# Patient Record
Sex: Female | Born: 1975 | Race: White | Hispanic: No | Marital: Married | Smoking: Former smoker
Health system: Southern US, Community
[De-identification: ages and names within clinical notes are randomized; demographics above are authoritative.]

## PROBLEM LIST (undated history)

## (undated) DIAGNOSIS — J45909 Unspecified asthma, uncomplicated: Secondary | ICD-10-CM

## (undated) HISTORY — PX: APPENDECTOMY: SHX54

## (undated) HISTORY — DX: Unspecified asthma, uncomplicated: J45.909

---

## 2018-06-30 DIAGNOSIS — J45909 Unspecified asthma, uncomplicated: Secondary | ICD-10-CM | POA: Insufficient documentation

## 2018-06-30 DIAGNOSIS — F411 Generalized anxiety disorder: Secondary | ICD-10-CM | POA: Insufficient documentation

## 2018-07-25 ENCOUNTER — Encounter: Payer: Self-pay | Admitting: Psychiatry

## 2018-07-25 ENCOUNTER — Other Ambulatory Visit: Payer: Self-pay

## 2018-07-25 ENCOUNTER — Ambulatory Visit: Payer: 59 | Admitting: Psychiatry

## 2018-07-25 VITALS — BP 132/80 | HR 80 | Temp 99.9°F | Wt 221.4 lb

## 2018-07-25 DIAGNOSIS — F5105 Insomnia due to other mental disorder: Secondary | ICD-10-CM

## 2018-07-25 DIAGNOSIS — F4322 Adjustment disorder with anxiety: Secondary | ICD-10-CM | POA: Diagnosis not present

## 2018-07-25 DIAGNOSIS — F410 Panic disorder [episodic paroxysmal anxiety] without agoraphobia: Secondary | ICD-10-CM | POA: Diagnosis not present

## 2018-07-25 MED ORDER — HYDROXYZINE PAMOATE 25 MG PO CAPS: 25.0000 mg | ORAL_CAPSULE | Freq: Every evening | ORAL | 1 refills | Status: DC | PRN

## 2018-07-25 NOTE — Patient Instructions (Signed)
Hydroxyzine capsules or tablets What is this medicine? HYDROXYZINE (hye DROX i zeen) is an antihistamine. This medicine is used to treat allergy symptoms. It is also used to treat anxiety and tension. This medicine can be used with other medicines to induce sleep before surgery. This medicine may be used for other purposes; ask your health care provider or pharmacist if you have questions. COMMON BRAND NAME(S): ANX, Atarax, Rezine, Vistaril What should I tell my health care provider before I take this medicine? They need to know if you have any of these conditions: -any chronic illness -difficulty passing urine -glaucoma -heart disease -kidney disease -liver disease -lung disease -an unusual or allergic reaction to hydroxyzine, cetirizine, other medicines, foods, dyes, or preservatives -pregnant or trying to get pregnant -breast-feeding How should I use this medicine? Take this medicine by mouth with a full glass of water. Follow the directions on the prescription label. You may take this medicine with food or on an empty stomach. Take your medicine at regular intervals. Do not take your medicine more often than directed. Talk to your pediatrician regarding the use of this medicine in children. Special care may be needed. While this drug may be prescribed for children as young as 6 years of age for selected conditions, precautions do apply. Patients over 65 years old may have a stronger reaction and need a smaller dose. Overdosage: If you think you have taken too much of this medicine contact a poison control center or emergency room at once. NOTE: This medicine is only for you. Do not share this medicine with others. What if I miss a dose? If you miss a dose, take it as soon as you can. If it is almost time for your next dose, take only that dose. Do not take double or extra doses. What may interact with this medicine? -alcohol -barbiturate medicines for sleep or seizures -medicines for  colds, allergies -medicines for depression, anxiety, or emotional disturbances -medicines for pain -medicines for sleep -muscle relaxants This list may not describe all possible interactions. Give your health care provider a list of all the medicines, herbs, non-prescription drugs, or dietary supplements you use. Also tell them if you smoke, drink alcohol, or use illegal drugs. Some items may interact with your medicine. What should I watch for while using this medicine? Tell your doctor or health care professional if your symptoms do not improve. You may get drowsy or dizzy. Do not drive, use machinery, or do anything that needs mental alertness until you know how this medicine affects you. Do not stand or sit up quickly, especially if you are an older patient. This reduces the risk of dizzy or fainting spells. Alcohol may interfere with the effect of this medicine. Avoid alcoholic drinks. Your mouth may get dry. Chewing sugarless gum or sucking hard candy, and drinking plenty of water may help. Contact your doctor if the problem does not go away or is severe. This medicine may cause dry eyes and blurred vision. If you wear contact lenses you may feel some discomfort. Lubricating drops may help. See your eye doctor if the problem does not go away or is severe. If you are receiving skin tests for allergies, tell your doctor you are using this medicine. What side effects may I notice from receiving this medicine? Side effects that you should report to your doctor or health care professional as soon as possible: -fast or irregular heartbeat -difficulty passing urine -seizures -slurred speech or confusion -tremor Side effects that   usually do not require medical attention (report to your doctor or health care professional if they continue or are bothersome): -constipation -drowsiness -fatigue -headache -stomach upset This list may not describe all possible side effects. Call your doctor for  medical advice about side effects. You may report side effects to FDA at 1-800-FDA-1088. Where should I keep my medicine? Keep out of the reach of children. Store at room temperature between 15 and 30 degrees C (59 and 86 degrees F). Keep container tightly closed. Throw away any unused medicine after the expiration date. NOTE: This sheet is a summary. It may not cover all possible information. If you have questions about this medicine, talk to your doctor, pharmacist, or health care provider.  2018 Elsevier/Gold Standard (2008-01-13 14:50:59)  

## 2018-07-25 NOTE — Progress Notes (Signed)
Psychiatric Initial Adult Assessment   Patient Identification: Kristin Vargas MRN:  [id number] Date of Evaluation:  07/25/2018 Referral Source:Dr.Neelam Welton Flakes Chief Complaint:  ' I am here to establish care." Chief Complaint    Establish Care; Stress; Anxiety; Panic Attack     Visit Diagnosis:    ICD-10-CM   1. Panic attacks F41.0   2. Adjustment disorder with anxious mood F43.22   3. Insomnia due to mental condition F51.05     History of Present Illness: [name] is a 42 year old Caucasian female, married, employed, lives in [place], has a history of recent onset anxiety attacks presented to the clinic today to establish care.  Patient reports she got a promotion at work almost a month ago.  She reports soon after that she started feeling anxious because she was going through a lot of changes at work.  She reports one night she woke up with severe chest pain and shortness of breath and felt as though she was going to have a heart attack.  She reports the next day she went to urgent care and was evaluated.  She reports her primary medical doctor did further workup on her and she was told that she does not have any cardiac problems.  She was prescribed medications for anxiety attacks like Lexapro, Klonopin and BuSpar by her PMD 3 weeks ago.  Patient however reports she did not even pick up the prescription.  She reports she is not a 'medication person' and does not like to be on a lot of medications in general.  Pt currently describes her symptoms as chest pain, racing thoughts, feeling nervous, shortness of breath which usually happens towards the end of the day and last for a few minutes to an hour or so.  She reports this has been ongoing since the past 3 weeks.  Since her last full-blown panic attack which happened the first time she went to the urgent care she has not had a full-blown panic attack however has had several partial anxiety attacks.  Patient reports she is unable to sleep at night  due to her anxiety symptoms and racing thoughts.  Patient reports she also has some appetite changes on and off.  She reports it happens when she is very stressed out.  Patient denies any suicidality.  Patient denies any perceptual disturbances.  Patient denies any homicidality.  Patient denies any history of trauma.  Patient denies any bipolar disorder  symptoms like mania or hypomanic symptoms.  Patient does report she had a difficult childhood.  She reports her mother and father separated when she were very young.  Her mother was an alcoholic.  She was raised by her father and his several wife's.  She reports he married several times and it was a struggle.  She reports she ran away from home at the age of 42 and got pregnant at the age of 58.  Her son is now 21 years old.  She denies abusing any drugs or alcohol.   Associated Signs/Symptoms: Depression Symptoms:  depressed mood, difficulty concentrating, anxiety, panic attacks, disturbed sleep, decreased appetite, (Hypo) Manic Symptoms:  denies Anxiety Symptoms:  Excessive Worry, Panic Symptoms, Psychotic Symptoms:  denies PTSD Symptoms: Negative  Past Psychiatric History: Patient was recently diagnosed with anxiety and was prescribed medications by her PMD 3 weeks ago.  Patient however has been noncompliant with medications.  Patient denies any inpatient mental health admissions.  Patient denies any suicide attempts.  Previous Psychotropic Medications: Yes  -Was prescribed Lexapro, Klonopin  and BuSpar however she reports she did not even pick up the prescription  Substance Abuse History in the last 12 months:  No.  Consequences of Substance Abuse: Negative  Past Medical History:  Past Medical History:  Diagnosis Date  . Asthma     Past Surgical History:  Procedure Laterality Date  . APPENDECTOMY      Family Psychiatric History: Mother-alcohol abuse.  Family History:  Family History  Problem Relation Age of  Onset  . Alcohol abuse Mother   . Epilepsy Paternal Grandmother   . Epilepsy Brother     Social History:   Social History   Socioeconomic History  . Marital status: Married    Spouse name: albano  . Number of children: 1  . Years of education: Not on file  . Highest education level: GED or equivalent  Occupational History  . Not on file  Social Needs  . Financial resource strain: Not hard at all  . Food insecurity:    Worry: Never true    Inability: Never true  . Transportation needs:    Medical: No    Non-medical: No  Tobacco Use  . Smoking status: Former Smoker    Types: Cigarettes    Last attempt to quit: 07/25/2000    Years since quitting: 18.0  . Smokeless tobacco: Never Used  Substance and Sexual Activity  . Alcohol use: Never    Frequency: Never  . Drug use: Never  . Sexual activity: Yes    Birth control/protection: None  Lifestyle  . Physical activity:    Days per week: 0 days    Minutes per session: 0 min  . Stress: To some extent  Relationships  . Social connections:    Talks on phone: Not on file    Gets together: Not on file    Attends religious service: Never    Active member of club or organization: No    Attends meetings of clubs or organizations: Never    Relationship status: Married  Other Topics Concern  . Not on file  Social History Narrative  . Not on file    Additional Social History: Patient is married.  She lives with her husband and her aunt in [place].  She reports she moved here from [place] where she was living with her husband a few years ago.  She has 1 adult son who is 12 years old with whom she has a good relationship with.  She is employed with [employer] and recently got promoted.  Patient reports she had a difficult childhood.  [place] was [place]  She was raised by her father and his several wife's.  Her dad and her mom separated when she was very young.  Her mom was an alcoholic.  She continues to  not have a good relationship with her mom.  She however reports she has a good relationship with her dad.  Allergies:   Allergies  Allergen Reactions  . Morphine And Related   . Penicillins     Metabolic Disorder Labs: No results found for: HGBA1C, MPG No results found for: PROLACTIN No results found for: CHOL, TRIG, HDL, CHOLHDL, VLDL, LDLCALC   Current Medications: Current Outpatient Medications  Medication Sig Dispense Refill  . hydrOXYzine (VISTARIL) 25 MG capsule Take 1-2 capsules (25-50 mg total) by mouth at bedtime as needed for anxiety. Anxiety and sleep 60 capsule 1   No current facility-administered medications for this visit.     Neurologic: Headache:  No Seizure: No Paresthesias:No  Musculoskeletal: Strength & Muscle Tone: within normal limits Gait & Station: normal Patient leans: N/A  Psychiatric Specialty Exam: Review of Systems  Psychiatric/Behavioral: The patient is nervous/anxious and has insomnia.   All other systems reviewed and are negative.   Blood pressure 132/80, pulse 80, temperature 99.9 F (37.7 C), temperature source Oral, weight 221 lb 6.4 oz (100.4 kg), last menstrual period 07/18/2018.There is no height or weight on file to calculate BMI.  General Appearance: Casual  Eye Contact:  Fair  Speech:  Clear and Coherent  Volume:  Normal  Mood:  Anxious  Affect:  Congruent  Thought Process:  Goal Directed and Descriptions of Associations: Intact  Orientation:  Full (Time, Place, and Person)  Thought Content:  Logical  Suicidal Thoughts:  No  Homicidal Thoughts:  No  Memory:  Immediate;   Fair Recent;   Fair Remote;   Fair  Judgement:  Fair  Insight:  Fair  Psychomotor Activity:  Normal  Concentration:  Concentration: Fair and Attention Span: Fair  Recall:  [employer] of Knowledge:Fair  Language: Fair  Akathisia:  No  Handed:  Right  AIMS (if indicated):  na  Assets:  Communication Skills Desire for  Improvement Housing Intimacy Social Support Talents/Skills Transportation Vocational/Educational  ADL's:  Intact  Cognition: WNL  Sleep:  restless    Treatment Plan Summary:Mossie is a 42 year old Caucasian female who is married, employed, lives in [place], has a history of recent onset anxiety attacks and insomnia presented to the clinic today to establish care.  Patient is biologically predisposed given her family history.  Patient does have psychosocial stressors of recent job related changes.  She does have good social support system.  She denies any past history of suicide attempts.  She denies any comorbid substance abuse problems or comorbid health problems.  Patient will benefit from psychotherapy sessions as well as medication management as noted below. Medication management and Plan as noted below  Plan For panic attacks She completed a GAD 7 and scored very low-5 Patient reports she does not want medications and prefers psychotherapy sessions. We will refer her to our therapist here in clinic. We will give her hydroxyzine 25-50 mg at bedtime as needed for anxiety attacks.  Adjustment disorder Patient completed PHQ 9 and scored very low-2 Hence its likely that patient does not have any overt depressive symptoms at this time.  Patient also prefers psychotherapy sessions over medications.  Will refer her for the same.  For insomnia Discussed with patient to use hydroxyzine 25-50 mg at bedtime for insomnia  Will order TSH-patient reports she will get it done with her PMD.  Follow-up in clinic in 3 weeks or sooner if needed.  More than 50 % of the time was spent for psychoeducation and supportive psychotherapy and care coordination.  This note was generated in part or whole with voice recognition software. Voice recognition is usually quite accurate but there are transcription errors that can and very often do occur. I apologize for any typographical errors that were not  detected and corrected.     Jomarie Longs, MD 11/12/20199:57 AM

## 2018-07-26 ENCOUNTER — Encounter: Payer: Self-pay | Admitting: Psychiatry

## 2018-08-05 ENCOUNTER — Ambulatory Visit: Payer: 59 | Admitting: Licensed Clinical Social Worker

## 2018-08-05 ENCOUNTER — Encounter: Payer: Self-pay | Admitting: Licensed Clinical Social Worker

## 2018-08-05 DIAGNOSIS — F4322 Adjustment disorder with anxiety: Secondary | ICD-10-CM | POA: Diagnosis not present

## 2018-08-05 DIAGNOSIS — F410 Panic disorder [episodic paroxysmal anxiety] without agoraphobia: Secondary | ICD-10-CM | POA: Diagnosis not present
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
## 2018-08-15 ENCOUNTER — Ambulatory Visit: Payer: 59 | Admitting: Psychiatry

## 2018-08-15 ENCOUNTER — Other Ambulatory Visit: Payer: Self-pay

## 2018-08-15 ENCOUNTER — Encounter: Payer: Self-pay | Admitting: Psychiatry

## 2018-08-15 VITALS — BP 133/84 | HR 83 | Temp 99.2°F | Ht 65.55 in | Wt 223.6 lb

## 2018-08-15 DIAGNOSIS — F410 Panic disorder [episodic paroxysmal anxiety] without agoraphobia: Secondary | ICD-10-CM

## 2018-08-15 DIAGNOSIS — F5105 Insomnia due to other mental disorder: Secondary | ICD-10-CM

## 2018-08-15 DIAGNOSIS — F4322 Adjustment disorder with anxiety: Secondary | ICD-10-CM | POA: Diagnosis not present

## 2018-08-15 NOTE — Progress Notes (Signed)
BH MD OP Progress Note  08/15/2018 3:45 PM Fawne [place]  MRN:  [id number]  Chief Complaint: ' I am here for follow up." Chief Complaint    Follow-up; Medication Refill     HPI: Albie is a 42 year old Caucasian female, married, employed, lives in [place], has a history of recent onset anxiety attacks, presented to the clinic today for a follow-up visit.  Patient today reports her mood symptoms as improving.  She reports she did have some anxiety attacks however they are getting better.  She is better able to cope with it.  She reports she has more good days than bad days now.  She is on hydroxyzine as needed.  She reports she may have taken it a couple of times with good benefit.  She denies any side effects to medications.  Patient reports sleep is improved.  She makes use of hydroxyzine at bedtime when she needs it for sleep and reports it as helpful.  Patient reports she has started psychotherapy sessions with Ms. Heidi Dach and reports she wants to continue to do so.  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  She reports she looks forward to working on that with her therapist.  She is not interested in any medication readjustment today and just wants to stay on hydroxyzine.  Patient denies any other concerns today. Visit Diagnosis:    ICD-10-CM   1. Panic attacks F41.0   2. Adjustment disorder with anxious mood F43.22   3. Insomnia due to mental condition F51.05     Past Psychiatric History: Have reviewed past psychiatric history from my progress note on 07/25/2018  Past Medical History:  Past Medical History:  Diagnosis Date  . Asthma     Past Surgical History:  Procedure Laterality Date  . APPENDECTOMY      Family Psychiatric History: Reviewed family psychiatric history from my progress note on 07/25/2018  Family History:  Family History  Problem Relation Age of Onset  . Alcohol abuse Mother   . Epilepsy Paternal  Grandmother   . Epilepsy Brother     Social History: Reviewed social history from my progress note on 07/25/2018. Social History   Socioeconomic History  . Marital status: Married    Spouse name: albano  . Number of children: 1  . Years of education: Not on file  . Highest education level: GED or equivalent  Occupational History  . Not on file  Social Needs  . Financial resource strain: Not hard at all  . Food insecurity:    Worry: Never true    Inability: Never true  . Transportation needs:    Medical: No    Non-medical: No  Tobacco Use  . Smoking status: Former Smoker    Types: Cigarettes    Last attempt to quit: 07/25/2000    Years since quitting: 18.0  . Smokeless tobacco: Never Used  Substance and Sexual Activity  . Alcohol use: Never    Frequency: Never  . Drug use: Never  . Sexual activity: Yes    Birth control/protection: None  Lifestyle  . Physical activity:    Days per week: 0 days    Minutes per session: 0 min  . Stress: To some extent  Relationships  . Social connections:    Talks on phone: Not on file    Gets together: Not on file    Attends religious service: Never    Active member of club or organization: No  Attends meetings of clubs or organizations: Never    Relationship status: Married  Other Topics Concern  . Not on file  Social History Narrative  . Not on file    Allergies:  Allergies  Allergen Reactions  . Morphine And Related   . Penicillins     Metabolic Disorder Labs: No results found for: HGBA1C, MPG No results found for: PROLACTIN No results found for: CHOL, TRIG, HDL, CHOLHDL, VLDL, LDLCALC No results found for: TSH  Therapeutic Level Labs: No results found for: LITHIUM No results found for: VALPROATE No components found for:  CBMZ  Current Medications: Current Outpatient Medications  Medication Sig Dispense Refill  . hydrOXYzine (VISTARIL) 25 MG capsule Take 1-2 capsules (25-50 mg total) by mouth at bedtime as  needed for anxiety. Anxiety and sleep 60 capsule 1   No current facility-administered medications for this visit.      Musculoskeletal: Strength & Muscle Tone: within normal limits Gait & Station: normal Patient leans: N/A  Psychiatric Specialty Exam: Review of Systems  Psychiatric/Behavioral: The patient is nervous/anxious (improving).   All other systems reviewed and are negative.   Blood pressure 133/84, pulse 83, temperature 99.2 F (37.3 C), temperature source Oral, height 5' 5.55" (1.665 m), weight 223 lb 9.6 oz (101.4 kg), last menstrual period 07/18/2018.Body mass index is 36.59 kg/m.  General Appearance: Casual  Eye Contact:  Good  Speech:  Normal Rate  Volume:  Normal  Mood:  Anxious- improving  Affect:  Congruent  Thought Process:  Goal Directed and Descriptions of Associations: Intact  Orientation:  Full (Time, Place, and Person)  Thought Content: Logical   Suicidal Thoughts:  No  Homicidal Thoughts:  No  Memory:  Immediate;   Fair Recent;   Fair Remote;   Fair  Judgement:  Fair  Insight:  Fair  Psychomotor Activity:  Normal  Concentration:  Concentration: Fair and Attention Span: Fair  Recall:  FiservFair  Fund of Knowledge: Fair  Language: Fair  Akathisia:  No  Handed:  Right  AIMS (if indicated): denies tremors, rigidity,stiffness  Assets:  Communication Skills Desire for Improvement Social Support  ADL's:  Intact  Cognition: WNL  Sleep:  Fair   Screenings:   Assessment and Plan: Monik is a 42 year old Caucasian female who is married, employed, lives in [place], has a history of anxiety attacks, insomnia, presented to the clinic today for a follow-up visit.  Patient is biologically predisposed given her family history.  She also has psychosocial stressors of recent job related problems.  Patient will continue medications which are beneficial at this time.  She will also pursue psychotherapy.  Plan Panic attacks Hydroxyzine 25-50 mg at bedtime as  needed for anxiety attacks.  Adjustment disorder We will continue CBT.  For insomnia Hydroxyzine is helpful for her sleep problems.  She rarely uses it.  I have reviewed the following labs from her primary medical doctor dated 07/27/2018.-CBC with differential-within normal limits, vitamin D-low at 28.3-being replaced by her PMD, vitamin B12-393-within normal limits, TSH- 1.640-within normal limits.  Follow-up in clinic in 1 month or sooner if needed.  More than 50 % of the time was spent for psychoeducation and supportive psychotherapy and care coordination.  This note was generated in part or whole with voice recognition software. Voice recognition is usually quite accurate but there are transcription errors that can and very often do occur. I apologize for any typographical errors that were not detected and corrected.       Akari Defelice,  MD 08/15/2018, 3:45 PM

## 2018-08-22 ENCOUNTER — Encounter: Payer: Self-pay | Admitting: Licensed Clinical Social Worker

## 2018-08-22 ENCOUNTER — Ambulatory Visit: Payer: 59 | Admitting: Licensed Clinical Social Worker

## 2018-08-22 DIAGNOSIS — F410 Panic disorder [episodic paroxysmal anxiety] without agoraphobia: Secondary | ICD-10-CM | POA: Diagnosis not present

## 2018-08-22 NOTE — Progress Notes (Signed)
   THERAPIST PROGRESS NOTE  Session Time: 1100  Participation Level: Active  Behavioral Response: NeatAlertAnxious  Type of Therapy: Individual Therapy  Treatment Goals addressed: Anxiety  Interventions: CBT  Summary: Kristin Vargas is a 42 y.o. female who presents with symptoms related to her diagnosis. Kristin Vargas reports things have been going well in terms of her anxiety and added, "I think I came here when I was really at the worst of it." Kristin Vargas denies having any panic attacks since our last visit. However, she reports things at work have continued to produce stress and anxiety. She stated, "the last three Tuesdays, I've had meetings where I've gotten in trouble for things that don't make sense." Kristin Vargas expressed frustration at the management style of her new boss, and frustration at not having clear expectations set for her. We discussed ways to manage anxiety and anger around her job, and ways to control what she is able to control about the situation. She reported she recognizes the only things she has control over are her actions, her words, and her feelings. We discussed the components of CBT, and how she can utilize them to manage her anxiety about work related stress. Kristin Vargas expressed understanding and agreement with this idea. She added she feels anxiety as, "if I left this job, I'd be taking a pay cut." We discussed ways to stay in the moment, and not get caught up in "what ifs." Kristin Vargas expressed understanding of this idea as well.   Suicidal/Homicidal: No  Therapist Response: Kristin Vargas presented with an anxious affect, and was able to speak openly about her anxiety symptoms. She was able to understand concepts presented to her during our session, and expressed comfort in validation offered by LCSW. We will continue to utilize CBT moving forward to manage anxiety symptoms.   Plan: Return again in 2 weeks.  Diagnosis: Axis I: Panic Attacks    Axis II: No diagnosis    Heidi DachKelsey Terriann Difonzo,  LCSW 08/22/2018

## 2018-08-23 ENCOUNTER — Emergency Department: Payer: 59

## 2018-08-23 ENCOUNTER — Emergency Department
Admission: EM | Admit: 2018-08-23 | Discharge: 2018-08-23 | Disposition: A | Discharge status: Home / Self Care | Payer: 59 | Attending: Emergency Medicine | Admitting: Emergency Medicine

## 2018-08-23 ENCOUNTER — Other Ambulatory Visit: Payer: Self-pay

## 2018-08-23 DIAGNOSIS — J45909 Unspecified asthma, uncomplicated: Secondary | ICD-10-CM | POA: Diagnosis not present

## 2018-08-23 DIAGNOSIS — Z87891 Personal history of nicotine dependence: Secondary | ICD-10-CM | POA: Insufficient documentation

## 2018-08-23 DIAGNOSIS — R079 Chest pain, unspecified: Secondary | ICD-10-CM | POA: Diagnosis present

## 2018-08-23 LAB — CBC
HCT: 41.4 % (ref 36.0–46.0)
Hemoglobin: 13.6 g/dL (ref 12.0–15.0)
MCH: 28.5 pg (ref 26.0–34.0)
MCHC: 32.9 g/dL (ref 30.0–36.0)
MCV: 86.8 fL (ref 80.0–100.0)
Platelets: 307 10*3/uL (ref 150–400)
RBC: 4.77 MIL/uL (ref 3.87–5.11)
RDW: 12.5 % (ref 11.5–15.5)
WBC: 9.2 10*3/uL (ref 4.0–10.5)
nRBC: 0 % (ref 0.0–0.2)

## 2018-08-23 LAB — BASIC METABOLIC PANEL
ANION GAP: 7 (ref 5–15)
BUN: 15 mg/dL (ref 6–20)
CHLORIDE: 109 mmol/L (ref 98–111)
CO2: 24 mmol/L (ref 22–32)
Calcium: 9.2 mg/dL (ref 8.9–10.3)
Creatinine, Ser: 0.76 mg/dL (ref 0.44–1.00)
GFR calc non Af Amer: >60 mL/min (ref >60)
GLUCOSE: 101 mg/dL — AB (ref 70–99)
Potassium: 3.7 mmol/L (ref 3.5–5.1)
Sodium: 140 mmol/L (ref 135–145)

## 2018-08-23 LAB — TROPONIN I
Troponin I: <0.03 ng/mL (ref <0.03)
Troponin I: <0.03 ng/mL (ref <0.03)

## 2018-08-23 MED ORDER — LORAZEPAM 0.5 MG PO TABS: 0.5000 mg | ORAL_TABLET | Freq: Three times a day (TID) | ORAL | 0 refills | Status: AC | PRN

## 2018-08-23 NOTE — ED Triage Notes (Signed)
Chest pain to center and left side of chest that started X 1 hour ago. Pt reports pain as dull, aching and constant. Denies SOB, nausea, diaphoresis. Hx of similar X 1 month ago. Pt alert and oriented X4, active, cooperative, pt in NAD. RR even and unlabored, color WNL.

## 2018-08-23 NOTE — ED Notes (Signed)
Pt states that last time she had chest pain she was anxious and referred to therapy. She states that today was a very stressful day at work and reports this may be the cause of the anxiety.

## 2018-08-23 NOTE — ED Provider Notes (Signed)
[facility] Emergency Department Provider Note  Time seen: 7:16 PM  I have reviewed the triage vital signs and the nursing notes.   HISTORY  Chief Complaint Chest Pain    HPI Kristin Vargas is a 42 y.o. female with a past medical history of asthma presents to the emergency department for central chest pain.  According to the patient she was in a meeting when around 3 PM today she developed central chest pain.  Denies any shortness of breath nausea or diaphoresis.  States the chest pain remained until she came to the emergency department however shortly after arrival the chest pain resolved.  Denies any pain or discomfort at this time.  No leg pain or swelling.  No hormonal use or estrogen use.  No history of DVT.  Patient states she had a similar pain 1 month ago and was told it was likely due to anxiety at that time.  Patient has a follow-up appointment tomorrow with her doctor.   Past Medical History:  Diagnosis Date  . Asthma     There are no active problems to display for this patient.   Past Surgical History:  Procedure Laterality Date  . APPENDECTOMY      Prior to Admission medications   Medication Sig Start Date End Date Taking? Authorizing Provider  hydrOXYzine (VISTARIL) 25 MG capsule Take 1-2 capsules (25-50 mg total) by mouth at bedtime as needed for anxiety. Anxiety and sleep 07/25/18   Jomarie Longs, MD    Allergies  Allergen Reactions  . Morphine And Related   . Penicillins     Family History  Problem Relation Age of Onset  . Alcohol abuse Mother   . Epilepsy Paternal Grandmother   . Epilepsy Brother     Social History Social History   Tobacco Use  . Smoking status: Former Smoker    Types: Cigarettes    Last attempt to quit: 07/25/2000    Years since quitting: 18.0  . Smokeless tobacco: Never Used  Substance Use Topics  . Alcohol use: Never    Frequency: Never  . Drug use: Never    Review of Systems Constitutional:  Negative for fever. Cardiovascular: Chest pain around 3 PM, which has since resolved. Respiratory: Negative for shortness of breath. Gastrointestinal: Negative for abdominal pain Genitourinary: Negative for urinary compaints Musculoskeletal: Negative for leg pain or swelling. Skin: Negative for skin complaints  Neurological: Negative for headache All other ROS negative  ____________________________________________   PHYSICAL EXAM:  VITAL SIGNS: ED Triage Vitals [08/23/18 1714]  Enc Vitals Group     BP 122/68     Pulse Rate 83     Resp 18     Temp 98.6 F (37 C)     Temp Source Oral     SpO2 98 %     Weight 220 lb (99.8 kg)     Height 5\' 5"  (1.651 m)     Head Circumference      Peak Flow      Pain Score 3     Pain Loc      Pain Edu?      Excl. in GC?    Constitutional: Alert and oriented. Well appearing and in no distress. Eyes: Normal exam ENT   Head: Normocephalic and atraumatic.   Mouth/Throat: Mucous membranes are moist. Cardiovascular: Normal rate, regular rhythm. No murmur Respiratory: Normal respiratory effort without tachypnea nor retractions. Breath sounds are clear Gastrointestinal: Soft and nontender. No distention.   Musculoskeletal: Nontender  with normal range of motion in all extremities. No lower extremity tenderness or edema. Neurologic:  Normal speech and language. No gross focal neurologic deficits  Skin:  Skin is warm, dry and intact.  Psychiatric: Mood and affect are normal. Speech and behavior are normal.   ____________________________________________    EKG  EKG viewed and interpreted by myself shows a normal sinus rhythm 87 bpm with a narrow QRS, normal axis, normal intervals, nonspecific ST changes without ST elevation.  ____________________________________________    RADIOLOGY  Chest x-ray negative  ____________________________________________   INITIAL IMPRESSION / ASSESSMENT AND PLAN / ED COURSE  Pertinent labs &  imaging results that were available during my care of the patient were reviewed by me and considered in my medical decision making (see chart for details).  Patient presents to the emergency department for chest pain beginning around 3 PM which is since resolved.  Patient states when it started she felt like her heart was racing and developed a pain in the center of her chest.  Denies any pleuritic component.  No leg pain or swelling.  No estrogen use.  Denies any recent cough or congestion.  Denies any pain at this time.  Denies any shortness of breath nausea or diaphoresis at any point.  Patient is PERC negative.  Patient's initial work-up is reassuring, lab work is normal including a negative troponin.  Chest x-ray and EKG are largely nonrevealing.  We will obtain a repeat troponin.  If the patient's repeat troponin is negative anticipate likely discharge home with cardiology follow-up.  I discussed this with the patient and she is agreeable to this plan of care.  Repeat troponin is negative.  Patient continues to feel well with reassuring vitals in the emergency department.  No chest pain.  We will discharge with a short course of Ativan however I did discuss follow-up with cardiology for a stress test.  Patient agreeable to plan of care and provided my normal chest pain return precautions. ____________________________________________   FINAL CLINICAL IMPRESSION(S) / ED DIAGNOSES  Chest pain    Minna AntisPaduchowski, Evlyn Amason, MD 08/23/18 2110

## 2018-09-05 ENCOUNTER — Ambulatory Visit: Payer: 59 | Admitting: Licensed Clinical Social Worker

## 2018-09-15 ENCOUNTER — Ambulatory Visit: Payer: 59 | Admitting: Psychiatry

## 2018-09-15 ENCOUNTER — Encounter: Payer: Self-pay | Admitting: Psychiatry

## 2018-09-15 ENCOUNTER — Other Ambulatory Visit: Payer: Self-pay

## 2018-09-15 VITALS — BP 115/78 | HR 82 | Temp 98.3°F | Wt 218.4 lb

## 2018-09-15 DIAGNOSIS — F410 Panic disorder [episodic paroxysmal anxiety] without agoraphobia: Secondary | ICD-10-CM

## 2018-09-15 DIAGNOSIS — F4322 Adjustment disorder with anxiety: Secondary | ICD-10-CM

## 2018-09-15 DIAGNOSIS — F5105 Insomnia due to other mental disorder: Secondary | ICD-10-CM | POA: Diagnosis not present

## 2018-09-15 NOTE — Progress Notes (Signed)
[facility] MD OP Progress Note  09/15/2018 10:40 AM Kristin Vargas  MRN:  [id number]  Chief Complaint:  Chief Complaint    Follow-up; Medication Refill    ' I am here for follow up.'  HPI: Kristin Vargas is a 43 year old Caucasian female, married, employed, lives in [place], has a history of recent onset anxiety attacks, presented to clinic today for a follow-up visit.  Patient today reports she is currently sick with a respiratory tract infection.  She reports she has been sick since the past 1-1/2 weeks.  She reports her whole family is sick as well.  She is planning to go to her primary medical doctor this afternoon.  Patient reports she also was seen in the emergency department after a chest pain on December 10.  Patient reports she was not having any anxiety or panic attack symptoms that day and was feeling better however started having a chest pain.  She reports she had a stress test done which came back abnormal and she has cardiac angiogram scheduled on Monday with her cardiologist.  Patient continues to deny any significant panic attacks, sleep problems or other mood symptoms at this time.  She reports she does not like to take medications in general and hence tried her best not to take hydroxyzine as much.  She has needed it only twice since November.  She continues to be in therapy with Ms. Heidi DachKelsey Craig which is going well.  She reports the therapy visits is going well.  Patient denies any suicidality.  Patient denies any homicidality.  Patient denies any perceptual disturbances.  Patient denies any other concerns today. Visit Diagnosis:    ICD-10-CM   1. 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 Adjustment disorder with anxious mood F43.22     Past Psychiatric History: Have reviewed past psychiatric history from my progress note on 07/25/2018.  Past Medical History:  Past Medical History:  Diagnosis Date  . Asthma     Past Surgical History:  Procedure  Laterality Date  . APPENDECTOMY      Family Psychiatric History: I have reviewed family psychiatric history from my progress note on 07/25/2018. Family History:  Family History  Problem Relation Age of Onset  . Alcohol abuse Mother   . Epilepsy Paternal Grandmother   . Epilepsy Brother     Social History: I have reviewed social history from my progress note on 07/25/2018. Social History   Socioeconomic History  . Marital status: Married    Spouse name: albano  . Number of children: 1  . Years of education: Not on file  . Highest education level: GED or equivalent  Occupational History  . Not on file  Social Needs  . Financial resource strain: Not hard at all  . Food insecurity:    Worry: Never true    Inability: Never true  . Transportation needs:    Medical: No    Non-medical: No  Tobacco Use  . Smoking status: Former Smoker    Types: Cigarettes    Last attempt to quit: 07/25/2000    Years since quitting: 18.1  . Smokeless tobacco: Never Used  Substance and Sexual Activity  . Alcohol use: Never    Frequency: Never  . Drug use: Never  . Sexual activity: Yes    Birth control/protection: None  Lifestyle  . Physical activity:    Days per week: 0 days    Minutes per session: 0 min  . Stress: To  some extent  Relationships  . Social connections:    Talks on phone: Not on file    Gets together: Not on file    Attends religious service: Never    Active member of club or organization: No    Attends meetings of clubs or organizations: Never    Relationship status: Married  Other Topics Concern  . Not on file  Social History Narrative  . Not on file    Allergies:  Allergies  Allergen Reactions  . Morphine And Related Anaphylaxis  . Penicillins Other (See Comments)    Does not work for patient     Metabolic Disorder Labs: No results found for: HGBA1C, MPG No results found for: PROLACTIN No results found for: CHOL, TRIG, HDL, CHOLHDL, VLDL, LDLCALC No  results found for: TSH  Therapeutic Level Labs: No results found for: LITHIUM No results found for: VALPROATE No components found for:  CBMZ  Current Medications: Current Outpatient Medications  Medication Sig Dispense Refill  . cholecalciferol (VITAMIN D) 25 MCG (1000 UT) tablet Take 1,000 Units by mouth daily.    . hydrOXYzine (VISTARIL) 25 MG capsule Take 1-2 capsules (25-50 mg total) by mouth at bedtime as needed for anxiety. Anxiety and sleep 60 capsule 1  . ibuprofen (ADVIL,MOTRIN) 200 MG tablet Take 200-400 mg by mouth every 6 (six) hours as needed for mild pain or moderate pain.    Marland Kitchen. LORazepam (ATIVAN) 0.5 MG tablet Take 1 tablet (0.5 mg total) by mouth every 8 (eight) hours as needed for anxiety. 20 tablet 0  . Multiple Vitamin (MULTIVITAMIN WITH MINERALS) TABS tablet Take 1 tablet by mouth daily.     No current facility-administered medications for this visit.      Musculoskeletal: Strength & Muscle Tone: within normal limits Gait & Station: normal Patient leans: N/A  Psychiatric Specialty Exam: Review of Systems  Respiratory: Positive for cough.   Psychiatric/Behavioral: The patient has insomnia (due to URI).   All other systems reviewed and are negative.   Blood pressure 115/78, pulse 82, temperature 98.3 F (36.8 C), temperature source Oral, weight 218 lb 6.4 oz (99.1 kg).Body mass index is 36.34 kg/m.  General Appearance: Casual  Eye Contact:  Fair  Speech:  Normal Rate  Volume:  Normal  Mood:  Euthymic  Affect:  Congruent  Thought Process:  Goal Directed and Descriptions of Associations: Intact  Orientation:  Full (Time, Place, and Person)  Thought Content: Logical   Suicidal Thoughts:  No  Homicidal Thoughts:  No  Memory:  Immediate;   Fair Recent;   Fair Remote;   Fair  Judgement:  Fair  Insight:  Fair  Psychomotor Activity:  Normal  Concentration:  Concentration: Fair and Attention Span: Fair  Recall:  FiservFair  Fund of Knowledge: Fair  Language:  Fair  Akathisia:  No  Handed:  Right  AIMS (if indicated): Denies tremors, rigidity,stiffness  Assets:  Communication Skills Desire for Improvement Social Support  ADL's:  Intact  Cognition: WNL  Sleep:  Restless due to an URI   Screenings:   Assessment and Plan: Sharay is a 43 year old Caucasian female who is married, employed, lives in [place], has a history of anxiety attacks, insomnia, presented to the clinic today for a follow-up visit.  Patient is biologically predisposed given her family history.  She also has psychosocial stressors of recent job related problems.  Patient however reports her anxiety symptoms as improving.  She however has an upper respiratory infection as well as recent chest  pain and has upcoming follow-up with her provider.  She will continue psychotherapy sessions which are going well.  Plan as noted below.  Plan Panic attacks Hydroxyzine 25 to 50 mg at bedtime as needed for anxiety attacks  Adjustment disorder She will continue CBT  For insomnia She reports her sleep is getting better up until her respiratory infection.  She reports she has restless sleep due to her cough.  She will continue psychotherapy sessions with Heidi Dach of her therapist here in clinic  Follow-up in clinic in 2 to 3 months or sooner if needed.  More than 50 % of the time was spent for psychoeducation and supportive psychotherapy and care coordination.  This note was generated in part or whole with voice recognition software. Voice recognition is usually quite accurate but there are transcription errors that can and very often do occur. I apologize for any typographical errors that were not detected and corrected.       Jomarie Longs, MD 09/15/2018, 10:40 AM

## 2018-10-03 ENCOUNTER — Encounter: Payer: Self-pay | Admitting: Licensed Clinical Social Worker

## 2018-10-03 ENCOUNTER — Ambulatory Visit: Payer: 59 | Admitting: Licensed Clinical Social Worker

## 2018-10-03 DIAGNOSIS — F410 Panic disorder [episodic paroxysmal anxiety] without agoraphobia: Secondary | ICD-10-CM | POA: Diagnosis not present

## 2018-10-03 NOTE — Progress Notes (Signed)
   THERAPIST PROGRESS NOTE  Session Time: 0800  Participation Level: Active  Behavioral Response: NeatAlertAnxious  Type of Therapy: Individual Therapy  Treatment Goals addressed: Anxiety  Interventions: CBT  Summary: Kristin Vargas is a 43 y.o. female who presents with symptoms related to her diagnosis. 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 She added she has experienced several medical problems since our last session, but reports they have all resolved as well. She stated her living situation is contributing stress to her life because she and her husband live with pt's aunt, who is "not very neat and her dog goes to the bathroom everywhere." Kristin Vargas reports feeling resentful when she gets home as her husband immediately wants to complain to her about her aunt. She stated this is difficult as her husband is home all day while she is at work. We discussed how this can contribute to their relationship dynamics, and ways she could improve daily communication between her and her husband. We discussed the idea of focusing on solutions rather than problems, but also having clear boundaries and not taking on more than she is able to handle emotionally. Kristin Vargas expressed understanding and agreement with this idea as well.   Kristin Vargas added she will not be able to attend therapy sessions more than once a month due to financial constraints. LCSW expressed understanding and encouraged Kristin Vargas to come in sooner should anything come up.   Suicidal/Homicidal: No  Therapist Response: Kristin Vargas is able to utilize skills discussed in previous sessions in her daily life. She presented with a brighter affect today than at previous sessions. We will continue to utilize CBT moving forward to assist Kristin Vargas in managing her anxiety and panic symptoms.   Plan: Return again in 4 weeks.  Diagnosis: Axis I: Panic Disorder    Axis II: No  diagnosis    Heidi Dach, LCSW 10/03/2018

## 2018-11-07 ENCOUNTER — Encounter: Payer: Self-pay | Admitting: Licensed Clinical Social Worker

## 2018-11-07 ENCOUNTER — Ambulatory Visit: Payer: 59 | Admitting: Licensed Clinical Social Worker

## 2018-11-07 DIAGNOSIS — F410 Panic disorder [episodic paroxysmal anxiety] without agoraphobia: Secondary | ICD-10-CM | POA: Diagnosis not present

## 2018-11-07 NOTE — Progress Notes (Signed)
   THERAPIST PROGRESS NOTE  Session Time: 0800-0850  Participation Level: Active  Behavioral Response: CasualAlertAnxious  Type of Therapy: Individual Therapy  Treatment Goals addressed: Coping  Interventions: Supportive  Summary: Kristin Vargas is a 43 y.o. female who presents with continued symptoms of her diagnosis. Kristin Vargas reports things have been going well since our last session. She reports she was able to communicate to her husband that he needed to find something to fill his time with and he was able to do that. She reported he began playing a game, which has now consumed his free time. Kristin Vargas expressed frustration that he has now stopped doing everything else around the house, but has stopped being so upset about her aunt. We discussed communication and how she could discuss this with him appropriately. She added this morning prior to leaving for her appointment, "he picked a fight with me. He started going off about the recyclables and calling me incompetent and stupid." She reported she then escalated and it turned into a screaming match. We discussed ways to maintain fair fighting rules, such as having a list of things you do not say to each other. Additionally, LCSW encouraged Kristin Vargas to utilize her presence in the conversation as leverage. For instance, if her husband refuses to calm down in a situation, assert that if he cannot communicate in an appropriate way, she will leave the house until he can calm down. Kristin Vargas expressed understanding and agreement of how this could be utilized. Additionally, we discussed having a conversation around communication when things are not escalated in order to prepare for the next argument. Kristin Vargas expressed understanding and agreement with this idea as well.   Suicidal/Homicidal: No  Therapist Response: Aliyanna continues to work towards her tx goals but has not yet reached them. We will continue to utilize CBT moving forward to assist Kristin Vargas in managing her  anxiety and depression symptoms.   Plan: Return again in 4 weeks.  Diagnosis: Axis I: Panic Disorder    Axis II: No diagnosis    Heidi Dach, LCSW 11/07/2018

## 2018-11-14 ENCOUNTER — Ambulatory Visit: Payer: 59 | Admitting: Psychiatry

## 2018-12-12 ENCOUNTER — Ambulatory Visit: Payer: 59 | Admitting: Licensed Clinical Social Worker

## 2019-04-11 DIAGNOSIS — M7670 Peroneal tendinitis, unspecified leg: Secondary | ICD-10-CM

## 2019-04-11 DIAGNOSIS — M25879 Other specified joint disorders, unspecified ankle and foot: Secondary | ICD-10-CM | POA: Insufficient documentation

## 2019-04-11 DIAGNOSIS — M659 Unspecified synovitis and tenosynovitis, unspecified site: Secondary | ICD-10-CM | POA: Insufficient documentation

## 2019-04-11 DIAGNOSIS — S86319A Strain of muscle(s) and tendon(s) of peroneal muscle group at lower leg level, unspecified leg, initial encounter: Secondary | ICD-10-CM

## 2019-04-11 HISTORY — DX: Strain of muscle(s) and tendon(s) of peroneal muscle group at lower leg level, unspecified leg, initial encounter: S86.319A

## 2019-04-11 HISTORY — DX: Peroneal tendinitis, unspecified leg: M76.70

## 2019-04-11 HISTORY — DX: Other specified joint disorders, unspecified ankle and foot: M25.879

## 2019-06-05 DIAGNOSIS — M25373 Other instability, unspecified ankle: Secondary | ICD-10-CM

## 2019-06-05 HISTORY — DX: Other instability, unspecified ankle: M25.373

## 2019-06-26 DIAGNOSIS — M792 Neuralgia and neuritis, unspecified: Secondary | ICD-10-CM | POA: Insufficient documentation

## 2019-08-03 DIAGNOSIS — M84376A Stress fracture, unspecified foot, initial encounter for fracture: Secondary | ICD-10-CM | POA: Insufficient documentation

## 2019-08-03 HISTORY — DX: Stress fracture, unspecified foot, initial encounter for fracture: M84.376A

## 2019-10-18 ENCOUNTER — Ambulatory Visit: Payer: 59 | Attending: Internal Medicine

## 2019-10-18 DIAGNOSIS — Z20822 Contact with and (suspected) exposure to covid-19: Secondary | ICD-10-CM

## 2019-10-19 LAB — NOVEL CORONAVIRUS, NAA: SARS-CoV-2, NAA: NOT DETECTED

## 2020-01-01 DIAGNOSIS — T849XXA Unspecified complication of internal orthopedic prosthetic device, implant and graft, initial encounter: Secondary | ICD-10-CM

## 2020-01-01 DIAGNOSIS — M659 Unspecified synovitis and tenosynovitis, unspecified site: Secondary | ICD-10-CM | POA: Insufficient documentation

## 2020-01-01 DIAGNOSIS — M65979 Unspecified synovitis and tenosynovitis, unspecified ankle and foot: Secondary | ICD-10-CM

## 2020-01-01 HISTORY — DX: Unspecified complication of internal orthopedic prosthetic device, implant and graft, initial encounter: T84.9XXA

## 2020-01-01 HISTORY — DX: Unspecified synovitis and tenosynovitis, unspecified ankle and foot: M65.979

## 2020-09-16 ENCOUNTER — Other Ambulatory Visit: Payer: Self-pay

## 2020-09-16 ENCOUNTER — Emergency Department: Payer: 59

## 2020-09-16 ENCOUNTER — Emergency Department
Admission: EM | Admit: 2020-09-16 | Discharge: 2020-09-16 | Disposition: A | Discharge status: Home / Self Care | Payer: 59 | Attending: Emergency Medicine | Admitting: Emergency Medicine

## 2020-09-16 ENCOUNTER — Encounter: Payer: Self-pay | Admitting: Emergency Medicine

## 2020-09-16 DIAGNOSIS — R079 Chest pain, unspecified: Secondary | ICD-10-CM | POA: Diagnosis present

## 2020-09-16 DIAGNOSIS — J45909 Unspecified asthma, uncomplicated: Secondary | ICD-10-CM | POA: Insufficient documentation

## 2020-09-16 DIAGNOSIS — Z87891 Personal history of nicotine dependence: Secondary | ICD-10-CM | POA: Insufficient documentation

## 2020-09-16 DIAGNOSIS — R1012 Left upper quadrant pain: Secondary | ICD-10-CM | POA: Insufficient documentation

## 2020-09-16 DIAGNOSIS — R0789 Other chest pain: Secondary | ICD-10-CM | POA: Diagnosis not present

## 2020-09-16 LAB — BASIC METABOLIC PANEL
Anion gap: 11 (ref 5–15)
BUN: 10 mg/dL (ref 6–20)
CO2: 26 mmol/L (ref 22–32)
Calcium: 9.2 mg/dL (ref 8.9–10.3)
Chloride: 100 mmol/L (ref 98–111)
Creatinine, Ser: 0.87 mg/dL (ref 0.44–1.00)
GFR, Estimated: >60 mL/min (ref >60)
Glucose, Bld: 90 mg/dL (ref 70–99)
Potassium: 3.6 mmol/L (ref 3.5–5.1)
Sodium: 137 mmol/L (ref 135–145)

## 2020-09-16 LAB — TROPONIN I (HIGH SENSITIVITY)
Troponin I (High Sensitivity): 4 ng/L (ref <18)
Troponin I (High Sensitivity): 4 ng/L (ref <18)

## 2020-09-16 LAB — CBC
HCT: 38.5 % (ref 36.0–46.0)
Hemoglobin: 12.5 g/dL (ref 12.0–15.0)
MCH: 26.7 pg (ref 26.0–34.0)
MCHC: 32.5 g/dL (ref 30.0–36.0)
MCV: 82.3 fL (ref 80.0–100.0)
Platelets: 309 10*3/uL (ref 150–400)
RBC: 4.68 MIL/uL (ref 3.87–5.11)
RDW: 13.4 % (ref 11.5–15.5)
WBC: 11.3 10*3/uL — ABNORMAL HIGH (ref 4.0–10.5)
nRBC: 0 % (ref 0.0–0.2)

## 2020-09-16 LAB — POC URINE PREG, ED: Preg Test, Ur: NEGATIVE

## 2020-09-16 MED ORDER — METOCLOPRAMIDE HCL 10 MG PO TABS: 10.0000 mg | ORAL_TABLET | Freq: Four times a day (QID) | ORAL | 0 refills | Status: DC | PRN

## 2020-09-16 MED ORDER — FAMOTIDINE 20 MG PO TABS
40.0000 mg | ORAL_TABLET | Freq: Once | ORAL | Status: AC
  Administered 2020-09-16: 40 mg via ORAL
  Filled 2020-09-16: qty 2

## 2020-09-16 MED ORDER — KETOROLAC TROMETHAMINE 10 MG PO TABS: 10.0000 mg | ORAL_TABLET | Freq: Four times a day (QID) | ORAL | 0 refills | Status: DC | PRN

## 2020-09-16 MED ORDER — DIPHENHYDRAMINE HCL 25 MG PO CAPS: 50.0000 mg | ORAL_CAPSULE | Freq: Four times a day (QID) | ORAL | 0 refills | Status: DC | PRN

## 2020-09-16 MED ORDER — ALUM & MAG HYDROXIDE-SIMETH 200-200-20 MG/5ML PO SUSP
30.0000 mL | Freq: Once | ORAL | Status: AC
  Administered 2020-09-16: 30 mL via ORAL
  Filled 2020-09-16: qty 30

## 2020-09-16 MED ORDER — NAPROXEN 500 MG PO TABS
500.0000 mg | ORAL_TABLET | Freq: Once | ORAL | Status: AC
  Administered 2020-09-16: 500 mg via ORAL
  Filled 2020-09-16: qty 1

## 2020-09-16 NOTE — ED Provider Notes (Signed)
[facility] Emergency Department Provider Note  ____________________________________________  Time seen: Approximately 5:23 PM  I have reviewed the triage vital signs and the nursing notes.   HISTORY  Chief Complaint Chest Pain    HPI Kristin Vargas is a 45 y.o. female with a history of asthma and anxiety who comes ED complaining of left lower chest/left upper quadrant abdomen pain which radiates to the left lateral chest, feels sharp.  Onset while sitting at her desk working, no strenuous activity.  Started about 2 hours ago, constant, no aggravating or alleviating factors.  Not pleuritic, not exertional.  Endorses some accompanying shortness of breath and feeling dizzy, no palpitations or syncope, no vomiting or diaphoresis.  Reports having an episode of similar pain 2 years ago, had an extensive work-up by cardiology Dr. Humphrey Rolls including stress test which was all unremarkable according to the patient.  These results are not available in the EMR.      Past Medical History:  Diagnosis Date  . Asthma      There are no problems to display for this patient.    Past Surgical History:  Procedure Laterality Date  . APPENDECTOMY       Prior to Admission medications   Medication Sig Start Date End Date Taking? Authorizing Provider  diphenhydrAMINE (BENADRYL) 25 mg capsule Take 2 capsules (50 mg total) by mouth every 6 (six) hours as needed (nausea or headache). 09/16/20  Yes Carrie Mew, MD  ketorolac (TORADOL) 10 MG tablet Take 1 tablet (10 mg total) by mouth every 6 (six) hours as needed for moderate pain. 09/16/20  Yes Carrie Mew, MD  metoCLOPramide (REGLAN) 10 MG tablet Take 1 tablet (10 mg total) by mouth every 6 (six) hours as needed (nausea or headache). 09/16/20  Yes Carrie Mew, MD  cholecalciferol (VITAMIN D) 25 MCG (1000 UT) tablet Take 1,000 Units by mouth daily.    [provider]  ibuprofen (ADVIL,MOTRIN) 200 MG tablet Take  200-400 mg by mouth every 6 (six) hours as needed for mild pain or moderate pain.    [provider]  Multiple Vitamin (MULTIVITAMIN WITH MINERALS) TABS tablet Take 1 tablet by mouth daily.    [provider]     Allergies Morphine and related, Sulfa antibiotics, Vicodin [hydrocodone-acetaminophen], and Penicillins   Family History  Problem Relation Age of Onset  . Alcohol abuse Mother   . Epilepsy Paternal Grandmother   . Epilepsy Brother     Social History Social History   Tobacco Use  . Smoking status: Former Smoker    Types: Cigarettes    Quit date: 07/25/2000    Years since quitting: 20.1  . Smokeless tobacco: Never Used  Vaping Use  . Vaping Use: Never used  Substance Use Topics  . Alcohol use: Never  . Drug use: Never    Review of Systems  Constitutional:   No fever or chills.  ENT:   No sore throat. No rhinorrhea. Cardiovascular: Positive atypical chest pain as above without syncope. Respiratory:   Positive shortness of breath without cough. Gastrointestinal:   Negative for abdominal pain, vomiting and diarrhea.  Musculoskeletal:   Negative for focal pain or swelling All other systems reviewed and are negative except as documented above in ROS and HPI.  ____________________________________________   PHYSICAL EXAM:  VITAL SIGNS: ED Triage Vitals [09/16/20 1606]  Enc Vitals Group     BP (!) 144/93     Pulse Rate 78     Resp 20  Temp 99.5 F (37.5 C)     Temp Source Oral     SpO2 99 %     Weight 217 lb (98.4 kg)     Height 5\' 5"  (1.651 m)     Head Circumference      Peak Flow      Pain Score 7     Pain Loc      Pain Edu?      Excl. in GC?     Vital signs reviewed, nursing assessments reviewed.   Constitutional:   Alert and oriented. Non-toxic appearance. Eyes:   Conjunctivae are normal. EOMI. PERRL. ENT      Head:   Normocephalic and atraumatic.      Nose:   Wearing a mask.      Mouth/Throat:   Wearing a mask.       Neck:   No meningismus. Full ROM.  No thyromegaly Hematological/Lymphatic/Immunilogical:   No cervical lymphadenopathy. Cardiovascular:   RRR. Symmetric bilateral radial and DP pulses.  No murmurs. Cap refill less than 2 seconds. Respiratory:   Normal respiratory effort without tachypnea/retractions. Breath sounds are clear and equal bilaterally. No wheezes/rales/rhonchi. Gastrointestinal:   Soft and nontender. Non distended. There is no CVA tenderness.  No rebound, rigidity, or guarding.  Musculoskeletal:   Normal range of motion in all extremities. No joint effusions.  No lower extremity tenderness.  No edema. Neurologic:   Normal speech and language.  Motor grossly intact. No acute focal neurologic deficits are appreciated.  Skin:    Skin is warm, dry and intact. No rash noted.  No petechiae, purpura, or bullae.  ____________________________________________    LABS (pertinent positives/negatives) (all labs ordered are listed, but only abnormal results are displayed) Labs Reviewed  CBC - Abnormal; Notable for the following components:      Result Value   WBC 11.3 (*)    All other components within normal limits  BASIC METABOLIC PANEL  POC URINE PREG, ED  TROPONIN I (HIGH SENSITIVITY)  TROPONIN I (HIGH SENSITIVITY)   ____________________________________________   EKG  Interpreted by me Normal sinus rhythm rate of 75, normal axis and intervals.  Poor progression.  Normal ST segments.  Isolated T wave inversion in lead III which is nonspecific  ____________________________________________    RADIOLOGY  DG Chest 2 View  Result Date: 09/16/2020 CLINICAL DATA:  Chest pain and shortness of breath EXAM: CHEST - 2 VIEW COMPARISON:  August 23, 2018 FINDINGS: Lungs are clear. Heart size and pulmonary vascularity are normal. No adenopathy. No pneumothorax. There is mild lower thoracic dextroscoliosis. IMPRESSION: Lungs clear.  Cardiac silhouette normal. Electronically Signed   By:  August 25, 2018 III M.D.   On: 09/16/2020 16:31    ____________________________________________   PROCEDURES Procedures  ____________________________________________  DIFFERENTIAL DIAGNOSIS   GERD, musculoskeletal pain, non-STEMI, pleurisy  CLINICAL IMPRESSION / ASSESSMENT AND PLAN / ED COURSE  Medications ordered in the ED: Medications  naproxen (NAPROSYN) tablet 500 mg (500 mg Oral Given 09/16/20 1733)  alum & mag hydroxide-simeth (MAALOX/MYLANTA) 200-200-20 MG/5ML suspension 30 mL (30 mLs Oral Given 09/16/20 1733)  famotidine (PEPCID) tablet 40 mg (40 mg Oral Given 09/16/20 1733)    Pertinent labs & imaging results that were available during my care of the patient were reviewed by me and considered in my medical decision making (see chart for details).  Tiombe May was evaluated in Emergency Department on 09/16/2020 for the symptoms described in the history of present illness. She was evaluated in the context  of the global COVID-19 pandemic, which necessitated consideration that the patient might be at risk for infection with the SARS-CoV-2 virus that causes COVID-19. Institutional protocols and algorithms that pertain to the evaluation of patients at risk for COVID-19 are in a state of rapid change based on information released by regulatory bodies including the CDC and federal and state organizations. These policies and algorithms were followed during the patient's care in the ED.   Patient presents with atypical left lower chest pain.  Vitals, exam, and EKG are unremarkable.  Initial labs are normal.  Will obtain a serial troponin, if not significantly rising will be stable for discharge home to follow-up with her cardiologist Dr. Welton Flakes.   Considering the patient's symptoms, medical history, and physical examination today, I have low suspicion for Unstable Angina, PE, TAD, pneumothorax, carditis, mediastinitis, pneumonia, CHF, or  sepsis.    ----------------------------------------- 7:25 PM on 09/16/2020 -----------------------------------------  Repeat troponin normal.  Pain unchanged, but with atypical nature, patient is stable for outpatient follow-up with reassuring work-up.  She has developed generalized headache while in the ED, suspect this is a evolving viral syndrome.  We will continue supportive care, recommend follow-up with PCP/cardiologist.      ____________________________________________   FINAL CLINICAL IMPRESSION(S) / ED DIAGNOSES    Final diagnoses:  Atypical chest pain     ED Discharge Orders         Ordered    metoCLOPramide (REGLAN) 10 MG tablet  Every 6 hours PRN        09/16/20 1924    diphenhydrAMINE (BENADRYL) 25 mg capsule  Every 6 hours PRN        09/16/20 1924    ketorolac (TORADOL) 10 MG tablet  Every 6 hours PRN        09/16/20 1924          Portions of this note were generated with dragon dictation software. Dictation errors may occur despite best attempts at proofreading.   Sharman Cheek, MD 09/16/20 [phone]

## 2020-09-16 NOTE — ED Triage Notes (Signed)
Pt comes into the ED via POV c/o left chest pain that is under her breast and radiates to the side.  Denies any radiating pain anywhere else.  Pt states she does have some SHOB and dizziness with the chest pain, but denies any Nausea.  Pt currently has even and unlabored respirations and denies any cardiac history.

## 2021-01-24 ENCOUNTER — Ambulatory Visit: Payer: 59 | Admitting: Cardiology

## 2021-02-02 DIAGNOSIS — M654 Radial styloid tenosynovitis [de Quervain]: Secondary | ICD-10-CM | POA: Insufficient documentation

## 2021-02-21 ENCOUNTER — Encounter: Payer: Self-pay | Admitting: Cardiology

## 2021-02-21 ENCOUNTER — Ambulatory Visit: Payer: 59 | Admitting: Cardiology

## 2021-02-21 ENCOUNTER — Other Ambulatory Visit: Payer: Self-pay

## 2021-02-21 VITALS — BP 122/77 | HR 68 | Ht 65.0 in | Wt 220.0 lb

## 2021-02-21 DIAGNOSIS — R072 Precordial pain: Secondary | ICD-10-CM | POA: Diagnosis not present

## 2021-02-21 MED ORDER — METOPROLOL TARTRATE 100 MG PO TABS: 100.0000 mg | ORAL_TABLET | Freq: Once | ORAL | 0 refills | Status: DC

## 2021-02-21 NOTE — Patient Instructions (Signed)
Medication Instructions:  Your physician recommends that you continue on your current medications as directed. Please refer to the Current Medication list given to you today.  *If you need a refill on your cardiac medications before your next appointment, please call your pharmacy*   Lab Work:  BMP to be drawn today.   If you have labs (blood work) drawn today and your tests are completely normal, you will receive your results only by: MyChart Message (if you have MyChart) OR A paper copy in the mail If you have any lab test that is abnormal or we need to change your treatment, we will call you to review the results.   Testing/Procedures:   Your physician has requested that you have a coronary cardiac CT. Cardiac computed tomography (CT) is a painless test that uses an    x-ray machine to take clear, detailed pictures of your heart.   Your cardiac CT will be scheduled at:  [facility] [address] [place], [place] [address] [phone]   Please arrive 15 mins early for check-in and test prep.   Please follow these instructions carefully (unless otherwise directed):    On the Night Before the Test: Be sure to Drink plenty of water. Do not consume any caffeinated/decaffeinated beverages or chocolate 12 hours prior to your test. Do not take any antihistamines 12 hours prior to your test.    On the Day of the Test: Drink plenty of water until 1 hour prior to the test. Do not eat any food 4 hours prior to the test. You may take your regular medications prior to the test.  Take metoprolol (Lopressor) two hours prior to test. HOLD Furosemide/Hydrochlorothiazide morning of the test. FEMALES- please wear underwire-free bra if available        After the Test: Drink plenty of water. After receiving IV contrast, you may experience a mild flushed feeling. This is normal. On occasion, you may experience a mild rash up to 24  hours after the test. This is not dangerous. If this occurs, you can take Benadryl 25 mg and increase your fluid intake. If you experience trouble breathing, this can be serious. If it is severe call 911 IMMEDIATELY. If it is mild, please call our office. If you take any of these medications: Glipizide/Metformin, Avandament, Glucavance, please do not take 48 hours after completing test unless otherwise instructed.   Once we have confirmed authorization from your insurance company, we will call you to set up a date and time for your test. Based on how quickly your insurance processes prior authorizations requests, please allow up to 4 weeks to be contacted for scheduling your Cardiac CT appointment. Be advised that routine Cardiac CT appointments could be scheduled as many as 8 weeks after your provider has ordered it.  For non-scheduling related questions, please contact the cardiac imaging nurse navigator should you have any questions/concerns: Rockwell Alexandria, Cardiac Imaging Nurse Navigator Larey Brick, Cardiac Imaging Nurse Navigator [facility] Direct Office Dial: [phone]   For scheduling needs, including cancellations and rescheduling, please call [place], [phone].     Follow-Up: At [facility], you and your health needs are our priority.  As part of our continuing mission to provide you with exceptional heart care, we have created designated Provider Care Teams.  These Care Teams include your primary Cardiologist (physician) and Advanced Practice Providers (APPs -  Physician Assistants and Nurse Practitioners) who all work together to provide  you with the care you need, when you need it.  We recommend signing up for the patient portal called "MyChart".  Sign up information is provided on this After Visit Summary.  MyChart is used to connect with patients for Virtual Visits (Telemedicine).  Patients are able to view lab/test results, encounter notes,  upcoming appointments, etc.  Non-urgent messages can be sent to your provider as well.   To learn more about what you can do with MyChart, go to [email or web address].    Your next appointment:   5 week(s)  The format for your next appointment:   In Person  Provider:   Debbe Odea, MD   Other Instructions

## 2021-02-21 NOTE — Progress Notes (Signed)
Cardiology Office Note:    Date:  02/21/2021   ID:  Kristin Jaeliana, Vargas 1975-10-09, MRN [id number]  PCP:  Margaretann Loveless, MD   [facility] Providers Cardiologist:  None     Referring MD: Miki Kins, FNP   No chief complaint on file.  Kristin Vargas is a 45 y.o. female who is being seen today for the evaluation of chest pain at the request of Miki Kins, FNP.   History of Present Illness:    [name] is a 45 y.o. female with a hx of asthma, vitamin D deficiency, former smoker x10 years who presents due to chest pain.  She states having symptoms of left-sided chest pain ongoing over the past 2 years.  Symptoms are not related with exertion, can last a couple of minutes to hours when it occurs.  Saw [facility] for cardiology where echo and Lexiscan Myoview stress test were normal.  Was recently seen in the hospital 6 months ago with chest pain symptoms, EKG and troponins were normal.  She would like a second opinion, as chest discomfort typically leads to anxiety and stress.  Past Medical History:  Diagnosis Date   Asthma     Past Surgical History:  Procedure Laterality Date   APPENDECTOMY      Current Medications: Current Meds  Medication Sig   cholecalciferol (VITAMIN D) 25 MCG (1000 UT) tablet Take 1,000 Units by mouth daily.   ibuprofen (ADVIL,MOTRIN) 200 MG tablet Take 200-400 mg by mouth every 6 (six) hours as needed for mild pain or moderate pain.   Multiple Vitamin (MULTIVITAMIN WITH MINERALS) TABS tablet Take 1 tablet by mouth daily.   [DISCONTINUED] metoprolol tartrate (LOPRESSOR) 100 MG tablet Take 1 tablet (100 mg total) by mouth once for 1 dose. Take 2 hours prior to your CT scan.     Allergies:   Morphine and related, Sulfa antibiotics, Vicodin [hydrocodone-acetaminophen], and Penicillins   Social History   Socioeconomic History   Marital status: Married    Spouse name: albano   Number of children: 1   Years of education: Not on file    Highest education level: GED or equivalent  Occupational History   Not on file  Tobacco Use   Smoking status: Former    Pack years: 0.00    Types: Cigarettes    Quit date: 07/25/2000    Years since quitting: 20.5   Smokeless tobacco: Never  Vaping Use   Vaping Use: Never used  Substance and Sexual Activity   Alcohol use: Never   Drug use: Never   Sexual activity: Yes    Birth control/protection: None  Other Topics Concern   Not on file  Social History Narrative   Not on file   Social Determinants of Health   Financial Resource Strain: Not on file  Food Insecurity: Not on file  Transportation Needs: Not on file  Physical Activity: Not on file  Stress: Not on file  Social Connections: Not on file     Family History: The patient's family history includes Alcohol abuse in her mother; Epilepsy in her brother and paternal grandmother.  ROS:   Please see the history of present illness.     All other systems reviewed and are negative.  EKGs/Labs/Other Studies Reviewed:    The following studies were reviewed today:   EKG:  EKG is  ordered today.  The ekg ordered today demonstrates normal sinus rhythm, normal ECG.  Recent Labs: 09/16/2020: BUN 10; Creatinine, Ser  0.87; Hemoglobin 12.5; Platelets 309; Potassium 3.6; Sodium 137  Recent Lipid Panel No results found for: CHOL, TRIG, HDL, CHOLHDL, VLDL, LDLCALC, LDLDIRECT   Risk Assessment/Calculations:      Physical Exam:    VS:  BP 122/77 (BP Location: Left Arm, Patient Position: Sitting, Cuff Size: Normal)   Pulse 68   Ht 5\' 5"  (1.651 m)   Wt 220 lb (99.8 kg)   SpO2 99%   BMI 36.61 kg/m     Wt Readings from Last 3 Encounters:  02/21/21 220 lb (99.8 kg)  09/16/20 217 lb (98.4 kg)  08/23/18 220 lb (99.8 kg)     GEN:  Well nourished, well developed in no acute distress HEENT: Normal NECK: No JVD; No carotid bruits LYMPHATICS: No lymphadenopathy CARDIAC: RRR, no murmurs, rubs, gallops RESPIRATORY:  Clear  to auscultation without rales, wheezing or rhonchi  ABDOMEN: Soft, non-tender, non-distended MUSCULOSKELETAL:  No edema; No deformity  SKIN: Warm and dry NEUROLOGIC:  Alert and oriented x 3 PSYCHIATRIC:  Normal affect   ASSESSMENT:    1. Precordial pain    PLAN:    In order of problems listed above:  Chest pain, former smoker, outside echocardiogram and Lexiscan Myoview unrevealing.  We will try to obtain previous cardiac testing records.  Plan for coronary CTA to evaluate presence of CAD.  If this is normal, testing will go along with to reassure patient, has had multiple clinic and ED visits due to symptoms of chest pain.  Follow-up after coronary CTA.     Medication Adjustments/Labs and Tests Ordered: Current medicines are reviewed at length with the patient today.  Concerns regarding medicines are outlined above.  Orders Placed This Encounter  Procedures   CT CORONARY MORPH W/CTA COR W/SCORE W/CA W/CM &/OR WO/CM   Basic metabolic panel   EKG 12-Lead    Meds ordered this encounter  Medications   DISCONTD: metoprolol tartrate (LOPRESSOR) 100 MG tablet    Sig: Take 1 tablet (100 mg total) by mouth once for 1 dose. Take 2 hours prior to your CT scan.    Dispense:  1 tablet    Refill:  0   metoprolol tartrate (LOPRESSOR) 100 MG tablet    Sig: Take 1 tablet (100 mg total) by mouth once for 1 dose. Take 2 hours prior to your CT scan.    Dispense:  1 tablet    Refill:  0     Patient Instructions  Medication Instructions:  Your physician recommends that you continue on your current medications as directed. Please refer to the Current Medication list given to you today.  *If you need a refill on your cardiac medications before your next appointment, please call your pharmacy*   Lab Work:  BMP to be drawn today.   If you have labs (blood work) drawn today and your tests are completely normal, you will receive your results only by: MyChart Message (if you have MyChart)  OR A paper copy in the mail If you have any lab test that is abnormal or we need to change your treatment, we will call you to review the results.   Testing/Procedures:   Your physician has requested that you have a coronary cardiac CT. Cardiac computed tomography (CT) is a painless test that uses an    x-ray machine to take clear, detailed pictures of your heart.   Your cardiac CT will be scheduled at:  [facility] [address] [place], [place] [phone]  [phone]   Please arrive 15 mins early for check-in and test prep.   Please follow these instructions carefully (unless otherwise directed):    On the Night Before the Test: Be sure to Drink plenty of water. Do not consume any caffeinated/decaffeinated beverages or chocolate 12 hours prior to your test. Do not take any antihistamines 12 hours prior to your test.    On the Day of the Test: Drink plenty of water until 1 hour prior to the test. Do not eat any food 4 hours prior to the test. You may take your regular medications prior to the test.  Take metoprolol (Lopressor) two hours prior to test. HOLD Furosemide/Hydrochlorothiazide morning of the test. FEMALES- please wear underwire-free bra if available        After the Test: Drink plenty of water. After receiving IV contrast, you may experience a mild flushed feeling. This is normal. On occasion, you may experience a mild rash up to 24 hours after the test. This is not dangerous. If this occurs, you can take Benadryl 25 mg and increase your fluid intake. If you experience trouble breathing, this can be serious. If it is severe call 911 IMMEDIATELY. If it is mild, please call our office. If you take any of these medications: Glipizide/Metformin, Avandament, Glucavance, please do not take 48 hours after completing test unless otherwise instructed.   Once we have confirmed authorization from your insurance company,  we will call you to set up a date and time for your test. Based on how quickly your insurance processes prior authorizations requests, please allow up to 4 weeks to be contacted for scheduling your Cardiac CT appointment. Be advised that routine Cardiac CT appointments could be scheduled as many as 8 weeks after your provider has ordered it.  For non-scheduling related questions, please contact the cardiac imaging nurse navigator should you have any questions/concerns: Rockwell Alexandria, Cardiac Imaging Nurse Navigator Larey Brick, Cardiac Imaging Nurse Navigator [place] [facility] Direct Office Dial: [phone]   For scheduling needs, including cancellations and rescheduling, please call [place], [phone].     Follow-Up: At [facility], you and your health needs are our priority.  As part of our continuing mission to provide you with exceptional heart care, we have created designated Provider Care Teams.  These Care Teams include your primary Cardiologist (physician) and Advanced Practice Providers (APPs -  Physician Assistants and Nurse Practitioners) who all work together to provide you with the care you need, when you need it.  We recommend signing up for the patient portal called "MyChart".  Sign up information is provided on this After Visit Summary.  MyChart is used to connect with patients for Virtual Visits (Telemedicine).  Patients are able to view lab/test results, encounter notes, upcoming appointments, etc.  Non-urgent messages can be sent to your provider as well.   To learn more about what you can do with MyChart, go to [email or web address].    Your next appointment:   5 week(s)  The format for your next appointment:   In Person  Provider:   Debbe Odea, MD   Other Instructions    Signed, Debbe Odea, MD  02/21/2021 12:27 PM    [facility]

## 2021-02-22 LAB — BASIC METABOLIC PANEL
BUN/Creatinine Ratio: 13 (ref 9–23)
BUN: 11 mg/dL (ref 6–24)
CO2: 23 mmol/L (ref 20–29)
Calcium: 9.5 mg/dL (ref 8.7–10.2)
Chloride: 101 mmol/L (ref 96–106)
Creatinine, Ser: 0.84 mg/dL (ref 0.57–1.00)
Glucose: 84 mg/dL (ref 65–99)
Potassium: 4.5 mmol/L (ref 3.5–5.2)
Sodium: 138 mmol/L (ref 134–144)
eGFR: 87 mL/min/{1.73_m2} (ref >59)

## 2021-03-11 ENCOUNTER — Telehealth (HOSPITAL_COMMUNITY): Payer: Self-pay | Admitting: Emergency Medicine

## 2021-03-11 NOTE — Telephone Encounter (Signed)
Attempted to call patient regarding upcoming cardiac CT appointment. °Left message on voicemail with name and callback number °Atari Novick RN Navigator Cardiac Imaging °[facility] °[phone] Office °[phone] Cell ° °

## 2021-03-13 ENCOUNTER — Ambulatory Visit
Admission: RE | Admit: 2021-03-13 | Discharge: 2021-03-13 | Disposition: A | Discharge status: Home / Self Care | Payer: 59 | Source: Ambulatory Visit | Attending: Cardiology | Admitting: Cardiology

## 2021-03-13 ENCOUNTER — Other Ambulatory Visit: Payer: Self-pay

## 2021-03-13 DIAGNOSIS — R072 Precordial pain: Secondary | ICD-10-CM | POA: Insufficient documentation

## 2021-03-13 MED ORDER — IOHEXOL 350 MG/ML SOLN
100.0000 mL | Freq: Once | INTRAVENOUS | Status: AC | PRN
  Administered 2021-03-13: 100 mL via INTRAVENOUS

## 2021-03-13 MED ORDER — NITROGLYCERIN 0.4 MG SL SUBL
0.8000 mg | SUBLINGUAL_TABLET | Freq: Once | SUBLINGUAL | Status: AC
  Administered 2021-03-13: 0.8 mg via SUBLINGUAL

## 2021-03-13 NOTE — Progress Notes (Signed)
Patient tolerated CT well. Drank coffee and water after.. Vital signs stable encourage to drink water throughout day.Reasons explained and verbalized understanding. Ambulated steady gait.  

## 2021-03-14 ENCOUNTER — Telehealth: Payer: Self-pay

## 2021-03-14 NOTE — Telephone Encounter (Signed)
Diane from [facility] called to inform us that there was an over read on this patients CCTA that was entered this AM. Will route to Dr. Nelma Rothman for any further  recommendations.

## 2021-03-18 ENCOUNTER — Telehealth: Payer: Self-pay

## 2021-03-18 NOTE — Telephone Encounter (Signed)
Patient called and VM left, see duplicate tele encounter.  Debbe Odea, MD  [place], Venissa Nappi, RN Caller: Unspecified (4 days ago,  8:45 AM) Small pulmonary nodules noted on chest CT.  Likely no need for follow-up or repeat chest CT yearly.  Have patient follow-up with primary care provider tosee if she needs yearly CT scans since as patient is a prior smoker.  Thank you

## 2021-03-18 NOTE — Telephone Encounter (Signed)
Left patient a VM with the following result note:  Minimal calcium, no evidence of obstructive coronary artery disease.  Overall benign coronary CTA with no significant findings to suggest etiology of chest pain.  Radiology over read mentions peripheral pulmonary nodule.  Please advise patient to follow-up with primary care provider regarding possible yearly follow-up of pulmonary nodule if indicated.  Encouraged patient to call back with any questions or concerns.

## 2021-03-28 ENCOUNTER — Other Ambulatory Visit: Payer: Self-pay

## 2021-03-28 ENCOUNTER — Encounter: Payer: Self-pay | Admitting: Cardiology

## 2021-03-28 ENCOUNTER — Ambulatory Visit: Payer: 59 | Admitting: Cardiology

## 2021-03-28 VITALS — BP 110/72 | HR 82 | Ht 65.0 in | Wt 221.0 lb

## 2021-03-28 DIAGNOSIS — R072 Precordial pain: Secondary | ICD-10-CM

## 2021-03-28 NOTE — Patient Instructions (Signed)

## 2021-03-28 NOTE — Progress Notes (Signed)
Cardiology Office Note:    Date:  03/28/2021   ID:  Kristin Vargas, Kristin Vargas 22-Oct-1975, MRN [id number]  PCP:  Margaretann Loveless, MD   [facility] Providers Cardiologist:  None     Referring MD: Margaretann Loveless, MD   Chief Complaint  Patient presents with   Other    Follow up post CT. Meds reviewed verbally with patient.      History of Present Illness:    Kristin Vargas is a 45 y.o. female with a hx of asthma, vitamin D deficiency, former smoker x10 years who presents for follow-up.  She was previously seen due to chest pain ongoing over a 2-year period.  Was seen at [facility] medical where echo and Lexiscan Myoview stress test was normal.  Symptoms still persisted.  Coronary CTA was obtained to evaluate presence of CAD.  Now presents for follow-up, no new concerns.  States having an episode of chest pain this morning which was sharp located on the left side of her heart lasted a few seconds and went away.    Past Medical History:  Diagnosis Date   Asthma     Past Surgical History:  Procedure Laterality Date   APPENDECTOMY      Current Medications: No outpatient medications have been marked as taking for the 03/28/21 encounter (Office Visit) with Debbe Odea, MD.     Allergies:   Morphine and related, Sulfa antibiotics, Vicodin [hydrocodone-acetaminophen], and Penicillins   Social History   Socioeconomic History   Marital status: Married    Spouse name: albano   Number of children: 1   Years of education: Not on file   Highest education level: GED or equivalent  Occupational History   Not on file  Tobacco Use   Smoking status: Former    Types: Cigarettes    Quit date: 07/25/2000    Years since quitting: 20.6   Smokeless tobacco: Never  Vaping Use   Vaping Use: Never used  Substance and Sexual Activity   Alcohol use: Never   Drug use: Never   Sexual activity: Yes    Birth control/protection: None  Other Topics Concern   Not on file  Social History Narrative    Not on file   Social Determinants of Health   Financial Resource Strain: Not on file  Food Insecurity: Not on file  Transportation Needs: Not on file  Physical Activity: Not on file  Stress: Not on file  Social Connections: Not on file     Family History: The patient's family history includes Alcohol abuse in her mother; Epilepsy in her brother and paternal grandmother.  ROS:   Please see the history of present illness.     All other systems reviewed and are negative.  EKGs/Labs/Other Studies Reviewed:    The following studies were reviewed today:   EKG:  EKG not ordered today.    Recent Labs: 09/16/2020: Hemoglobin 12.5; Platelets 309 02/21/2021: BUN 11; Creatinine, Ser 0.84; Potassium 4.5; Sodium 138  Recent Lipid Panel No results found for: CHOL, TRIG, HDL, CHOLHDL, VLDL, LDLCALC, LDLDIRECT   Risk Assessment/Calculations:      Physical Exam:    VS:  BP 110/72 (BP Location: Left Arm, Patient Position: Sitting, Cuff Size: Normal)   Pulse 82   Ht 5\' 5"  (1.651 m)   Wt 221 lb (100.2 kg)   SpO2 99%   BMI 36.78 kg/m     Wt Readings from Last 3 Encounters:  03/28/21 221 lb (100.2 kg)  02/21/21 220  lb (99.8 kg)  09/16/20 217 lb (98.4 kg)     GEN:  Well nourished, well developed in no acute distress HEENT: Normal NECK: No JVD; No carotid bruits LYMPHATICS: No lymphadenopathy CARDIAC: RRR, no murmurs, rubs, gallops RESPIRATORY:  Clear to auscultation without rales, wheezing or rhonchi  ABDOMEN: Soft, non-tender, non-distended MUSCULOSKELETAL:  No edema; No deformity  SKIN: Warm and dry NEUROLOGIC:  Alert and oriented x 3 PSYCHIATRIC:  Normal affect   ASSESSMENT:    1. Precordial pain     PLAN:    In order of problems listed above:  Chest pain, former smoker, outside echocardiogram and Lexiscan Myoview unrevealing.  Coronary CTA with minimal proximal LAD calcification, minimal non obstructive CAD noted.  Patient made aware of results, reassured that  etiology of chest discomfort is likely noncardiac.  Plans to follow-up with primary care provider for other etiologies including musculoskeletal.  Follow-up as needed     Medication Adjustments/Labs and Tests Ordered: Current medicines are reviewed at length with the patient today.  Concerns regarding medicines are outlined above.  No orders of the defined types were placed in this encounter.   No orders of the defined types were placed in this encounter.    Patient Instructions  Medication Instructions:  Your physician recommends that you continue on your current medications as directed. Please refer to the Current Medication list given to you today.  *If you need a refill on your cardiac medications before your next appointment, please call your pharmacy*   Lab Work: None ordered If you have labs (blood work) drawn today and your tests are completely normal, you will receive your results only by: MyChart Message (if you have MyChart) OR A paper copy in the mail If you have any lab test that is abnormal or we need to change your treatment, we will call you to review the results.   Testing/Procedures: None ordered   Follow-Up: At [facility], you and your health needs are our priority.  As part of our continuing mission to provide you with exceptional heart care, we have created designated Provider Care Teams.  These Care Teams include your primary Cardiologist (physician) and Advanced Practice Providers (APPs -  Physician Assistants and Nurse Practitioners) who all work together to provide you with the care you need, when you need it.  We recommend signing up for the patient portal called "MyChart".  Sign up information is provided on this After Visit Summary.  MyChart is used to connect with patients for Virtual Visits (Telemedicine).  Patients are able to view lab/test results, encounter notes, upcoming appointments, etc.  Non-urgent messages can be sent to your provider as  well.   To learn more about what you can do with MyChart, go to [email or web address].    Your next appointment:   Follow up as needed   The format for your next appointment:   In Person  Provider:   Debbe Odea, MD   Other Instructions    Signed, Debbe Odea, MD  03/28/2021 12:30 PM    [facility]

## 2021-04-16 ENCOUNTER — Encounter: Payer: Self-pay | Admitting: Emergency Medicine

## 2021-04-16 ENCOUNTER — Other Ambulatory Visit: Payer: Self-pay

## 2021-04-16 ENCOUNTER — Ambulatory Visit: Payer: 59 | Admitting: Emergency Medicine

## 2021-04-16 VITALS — BP 118/80 | HR 70 | Ht 65.0 in | Wt 220.4 lb

## 2021-04-16 DIAGNOSIS — J4520 Mild intermittent asthma, uncomplicated: Secondary | ICD-10-CM | POA: Diagnosis not present

## 2021-04-16 DIAGNOSIS — R918 Other nonspecific abnormal finding of lung field: Secondary | ICD-10-CM

## 2021-04-16 NOTE — Patient Instructions (Signed)
We will plan to repeat your CT chest in 1 year, June 2023. Follow Dr. Delton Coombes in June 2023 after your CT scan so that we can review the results together.

## 2021-04-16 NOTE — Progress Notes (Signed)
Subjective:    Patient ID: Kristin Vargas, female    DOB: June 13, 1976, 45 y.o.   MRN: [id number]  HPI 45 year old former smoker (9 pack years) who carries a history of asthma in childhood, sometimes still can get sx with a URI. She has been under evaluation for chest discomfort that included reassuring chemical stress test, echocardiogram.  She had coronary CT chest performed 03/13/2021 as below  She denies any symptoms, has not any dyspnea, cough.  She does not use albuterol.  Last need for this was years ago in the setting of URI.  She has never been diagnosed with malignancy of any kind  CT coronary 03/13/2021 reviewed by me, shows 4 mm right lower lobe peripheral nodule, 4 mm left lower lobe pulmonary nodule without calcification.  Limited views as this was a cardiac CT   Review of Systems As per HPI   Past Medical History:  Diagnosis Date   Asthma      Family History  Problem Relation Age of Onset   Alcohol abuse Mother    Epilepsy Paternal Grandmother    Epilepsy Brother     No known hx lung cancer  Social History   Socioeconomic History   Marital status: Married    Spouse name: albano   Number of children: 1   Years of education: Not on file   Highest education level: GED or equivalent  Occupational History   Not on file  Tobacco Use   Smoking status: Former    Types: Cigarettes    Start date: 25    Quit date: 07/25/2000    Years since quitting: 20.7   Smokeless tobacco: Never  Vaping Use   Vaping Use: Never used  Substance and Sexual Activity   Alcohol use: Never   Drug use: Never   Sexual activity: Yes    Birth control/protection: None  Other Topics Concern   Not on file  Social History Narrative   Not on file   Social Determinants of Health   Financial Resource Strain: Not on file  Food Insecurity: Not on file  Transportation Needs: Not on file  Physical Activity: Not on file  Stress: Not on file  Social Connections: Not on file  Intimate  Partner Violence: Not on file    From [place] originally;  Has lived in [place], [place], [place], [place], [place], [place].  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 Mold exposure Never positive PPD No hot tub Cats, dogs, had parrots as a child.  She lived for 7 yrs at [facility] - has been exposed to water that has increased risk for malignancy.    Allergies  Allergen Reactions   Morphine And Related Hives   Sulfa Antibiotics Hives   Vicodin [Hydrocodone-Acetaminophen] Hives   Penicillins Other (See Comments)    Does not work for patient      Outpatient Medications Prior to Visit  Medication Sig Dispense Refill   cholecalciferol (VITAMIN D) 25 MCG (1000 UT) tablet Take 1,000 Units by mouth daily.     ibuprofen (ADVIL,MOTRIN) 200 MG tablet Take 200-400 mg by mouth every 6 (six) hours as needed for mild pain or moderate pain.     Multiple Vitamin (MULTIVITAMIN WITH MINERALS) TABS tablet Take 1 tablet by mouth daily.     No facility-administered medications prior to visit.          Objective:   Physical Exam Vitals:   04/16/21 0937  BP: 118/80  Pulse: 70  SpO2: 99%  Weight: 220 lb 6.4 oz (100 kg)  Height: 5\' 5"  (1.651 m)   Gen: Pleasant, over wt woman, in no distress,  normal affect  ENT: No lesions,  mouth clear,  oropharynx clear, no postnasal drip  Neck: No JVD, no stridor  Lungs: No use of accessory muscles, no crackles or wheezing on normal respiration, no wheeze on forced expiration  Cardiovascular: RRR, heart sounds normal, no murmur or gallops, no peripheral edema  Musculoskeletal: No deformities, no cyanosis or clubbing  Neuro: alert, awake, non focal  Skin: Warm, no lesions or rash      Assessment & Plan:  Pulmonary nodules Overall low risk profile, 9 pack years tobacco, no family history.  That said she did live for 7 years at [facility] in childhood and there is new information regarding cancer risk associated with water exposure there.  Based on her  risk factors we have decided to follow her small pulmonary nodules for stability.  She will have a 43-month interval CT in June 2023.  Plan will be to follow for 2 years total if no interval change.  Mild intermittent asthma Largely inactive with symptoms only when she has URI or significant triggers.  She does not need maintenance therapy.  Does not use albuterol.  Plan to follow, consider adding medications if symptoms become more intrusive.   July 2023, MD, PhD 04/16/2021, 10:22 AM [facility] [phone] or if no answer before 7:00PM call [phone] For any issues after 7:00PM please call eLink [phone]

## 2021-04-16 NOTE — Assessment & Plan Note (Signed)
Overall low risk profile, 9 pack years tobacco, no family history.  That said she did live for 7 years at [facility] in childhood and there is new information regarding cancer risk associated with water exposure there.  Based on her risk factors we have decided to follow her small pulmonary nodules for stability.  She will have a 47-month interval CT in June 2023.  Plan will be to follow for 2 years total if no interval change.

## 2021-04-16 NOTE — Assessment & Plan Note (Signed)
Largely inactive with symptoms only when she has URI or significant triggers.  She does not need maintenance therapy.  Does not use albuterol.  Plan to follow, consider adding medications if symptoms become more intrusive.

## 2022-02-20 ENCOUNTER — Ambulatory Visit: Payer: 59

## 2022-12-21 IMAGING — CT CT HEART MORP W/ CTA COR W/ SCORE W/ CA W/CM &/OR W/O CM
1 of 13 series · 4 of 20 positions shown, 5 images · non-contrast
Comparison: None.

Addendum:
CLINICAL DATA: chestpain

EXAM:
Cardiac/Coronary  CTA
TECHNIQUE: The patient was scanned on a Siemens Somatom go.Top scanner.

[Series 27: multiphase % cta coronary 0.60 · axial · 0.35mm/px · z∈[-1155,-1083]mm · 4 of 3636 slices shown, 5 images]
[im 728/3636  vessel]
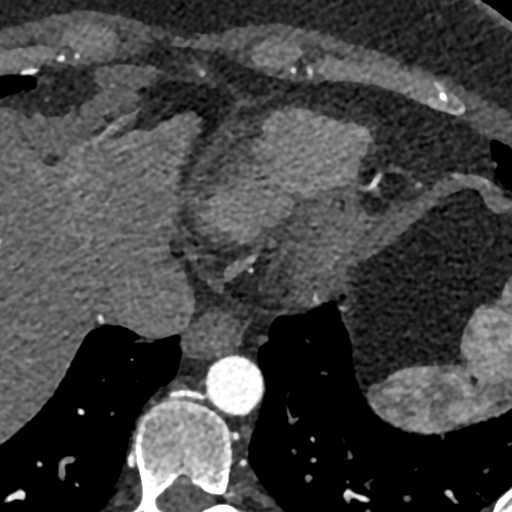
[im 728/3636  lung]
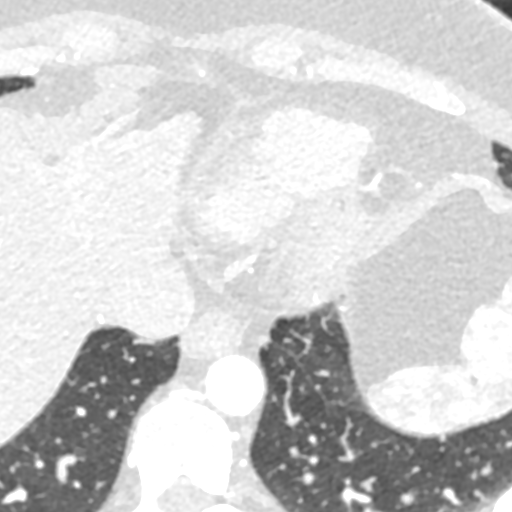
[im 1455/3636  vessel]
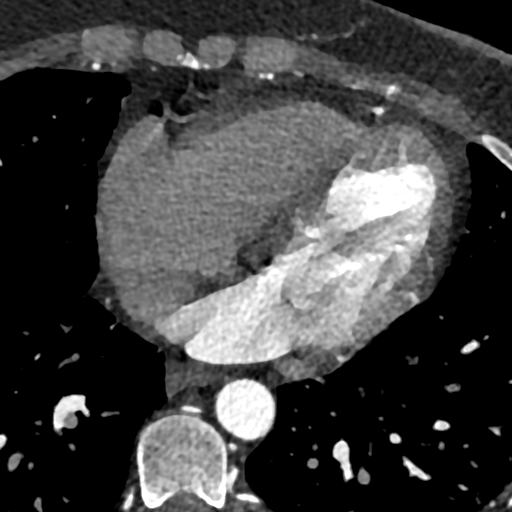
[im 2182/3636  vessel]
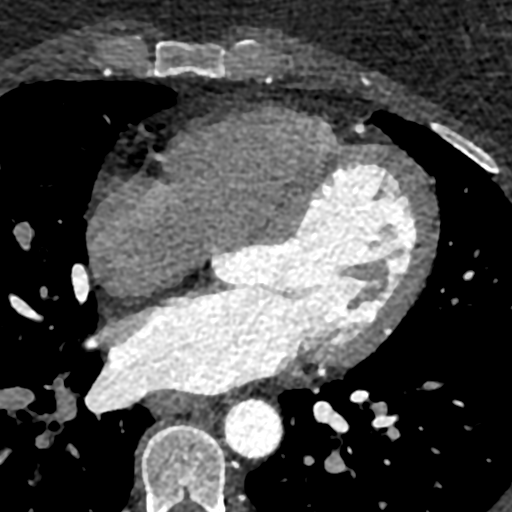
[im 2909/3636  vessel]
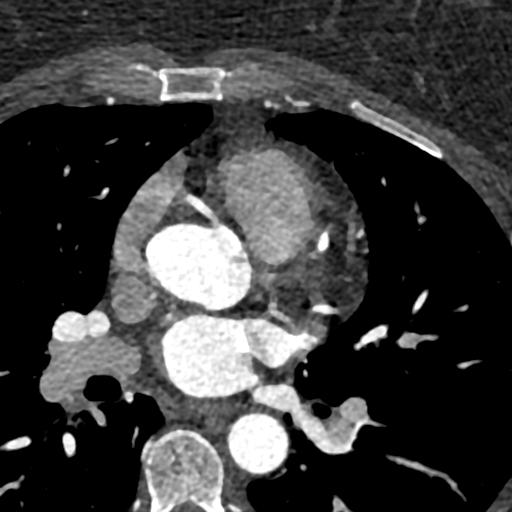

[4 of 20 positions shown; findings below may reference images not displayed]



Aortic Valve:  Trileaflet.  No calcifications.

Coronary Arteries:  Normal coronary origin.  Right dominance.

RCA is a dominant artery that gives rise to PDA and PLA. There is no
plaque.

Left main is a large artery that gives rise to LAD and LCX arteries.

LAD has mild proximal segment calcification causing minimal (<25%)
stenosis.

LCX is a non-dominant artery that gives rise to two obtuse marginal
branches. There is no plaque.

Other findings:

Normal pulmonary vein drainage into the left atrium.

Normal left atrial appendage without a thrombus.

Normal size of the pulmonary artery.
IMPRESSION: 1. Coronary calcium score of 4.81. This was 95th percentile for age
and sex matched control.

2. Normal coronary origin with right dominance.

3. Minimal proximal LAD stenosis (<25%).

4. CAD-RADS 1. Minimal non-obstructive CAD (0-24%). Consider
non-atherosclerotic causes of chest pain. Consider preventive
therapy and risk factor modification.

EXAM:
OVER-READ INTERPRETATION  CT CHEST

The following report is an over-read performed by radiologist Dr.
over-read does not include interpretation of cardiac or coronary
anatomy or pathology. The calcium score interpretation by the
cardiologist is attached.
FINDINGS: Limited view of the lung parenchyma demonstrates peripheral nodule
in the RIGHT lower lobe measuring 4 mm (image 22/13). Similar
peripheral nodule in the LEFT lower lobe measuring 4 mm image
31/13). Airways are normal.

Limited view of the mediastinum demonstrates no adenopathy.
Esophagus normal.

Limited view of the upper abdomen unremarkable.

Limited view of the skeleton and chest wall is unremarkable.
IMPRESSION: Peripheral pulmonary nodule in the RIGHT lower lobe and LEFT lower
lobe. No follow-up needed if patient is low-risk. Non-contrast chest
CT can be considered in 12 months if patient is high-risk. This
recommendation follows the consensus statement: Guidelines for
Management of Incidental Pulmonary Nodules Detected on CT Images:

These results will be called to the ordering clinician or
representative by the Radiologist Assistant, and communication
documented in the PACS or [REDACTED].



Aortic Valve:  Trileaflet.  No calcifications.

Coronary Arteries:  Normal coronary origin.  Right dominance.

RCA is a dominant artery that gives rise to PDA and PLA. There is no
plaque.

Left main is a large artery that gives rise to LAD and LCX arteries.

LAD has mild proximal segment calcification causing minimal (<25%)
stenosis.

LCX is a non-dominant artery that gives rise to two obtuse marginal
branches. There is no plaque.

Other findings:

Normal pulmonary vein drainage into the left atrium.

Normal left atrial appendage without a thrombus.

Normal size of the pulmonary artery.
IMPRESSION: 1. Coronary calcium score of 4.81. This was 95th percentile for age
and sex matched control.

2. Normal coronary origin with right dominance.

3. Minimal proximal LAD stenosis (<25%).

4. CAD-RADS 1. Minimal non-obstructive CAD (0-24%). Consider
non-atherosclerotic causes of chest pain. Consider preventive
therapy and risk factor modification.

## 2023-04-22 ENCOUNTER — Encounter: Payer: Self-pay | Admitting: Family

## 2023-04-22 ENCOUNTER — Ambulatory Visit: Payer: 59 | Admitting: Family

## 2023-04-22 VITALS — BP 140/95 | HR 79 | Ht 65.0 in | Wt 226.4 lb

## 2023-04-22 DIAGNOSIS — M791 Myalgia, unspecified site: Secondary | ICD-10-CM

## 2023-04-22 DIAGNOSIS — Z20822 Contact with and (suspected) exposure to covid-19: Secondary | ICD-10-CM | POA: Diagnosis not present

## 2023-04-22 DIAGNOSIS — J069 Acute upper respiratory infection, unspecified: Secondary | ICD-10-CM | POA: Diagnosis not present

## 2023-04-22 LAB — POCT XPERT XPRESS SARS COVID-2/FLU/RSV
FLU A: NEGATIVE
FLU B: NEGATIVE
RSV RNA, PCR: NEGATIVE
SARS Coronavirus 2: NEGATIVE

## 2023-04-22 MED ORDER — AZITHROMYCIN 250 MG PO TABS: ORAL_TABLET | ORAL | 0 refills | Status: AC

## 2023-04-22 NOTE — Progress Notes (Signed)
Established Patient Office Visit  Subjective:  Patient ID: Kristin Vargas, female    DOB: [birth year]  Age: 47 y.o. MRN: [id number]  Chief Complaint  Patient presents with   Acute Visit    Possible sinus infection    Headache Low grade fever yesterday Sinus pressure Hoarseness  Low back pain. - Has been hurting for about 3 days.   URI  This is a new problem. The current episode started in the past 7 days. The problem has been rapidly worsening. The maximum temperature recorded prior to her arrival was 100.4 - 100.9 F. The fever has been present for Less than 1 day. Associated symptoms include congestion, headaches, rhinorrhea and sinus pain. Pertinent negatives include no nausea or sore throat. Associated symptoms comments: Low back pain Hoarseness. She has tried acetaminophen, decongestant, antihistamine, increased fluids, NSAIDs and sleep for the symptoms. The treatment provided no relief.    No other concerns at this time.   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   Impingement of anterior part of ankle 04/11/2019   Peroneal tendinitis 04/11/2019   Strain of peroneal tendon 04/11/2019   Stress fracture of metatarsal bone 08/03/2019   Synovitis of ankle 01/01/2020    Past Surgical History:  Procedure Laterality Date   APPENDECTOMY      Social History   Socioeconomic History   Marital status: Married    Spouse name: albano   Number of children: 1   Years of education: Not on file   Highest education level: GED or equivalent  Occupational History   Not on file  Tobacco Use   Smoking status: Former    Current packs/day: 0.00    Types: Cigarettes    Start date: 18    Quit date: 07/25/2000    Years since quitting: 22.7   Smokeless tobacco: Never  Vaping Use   Vaping status: Never Used  Substance and Sexual Activity   Alcohol use: Never   Drug use: Never    Sexual activity: Yes    Birth control/protection: None  Other Topics Concern   Not on file  Social History Narrative   Not on file   Social Determinants of Health   Financial Resource Strain: Low Risk  (07/25/2018)   Overall Financial Resource Strain (CARDIA)    Difficulty of Paying Living Expenses: Not hard at all  Food Insecurity: No Food Insecurity (07/25/2018)   Hunger Vital Sign    Worried About Running Out of Food in the Last Year: Never true    Ran Out of Food in the Last Year: Never true  Transportation Needs: No Transportation Needs (07/25/2018)   PRAPARE - Administrator, Civil Service (Medical): No    Lack of Transportation (Non-Medical): No  Physical Activity: Inactive (07/25/2018)   Exercise Vital Sign    Days of Exercise per Week: 0 days    Minutes of Exercise per Session: 0 min  Stress: Stress Concern Present (07/25/2018)   [employer] of Occupational Health - Occupational Stress Questionnaire    Feeling of Stress : To some extent  Social Connections: Unknown (07/25/2018)   Social Connection and Isolation Panel [NHANES]    Frequency of Communication with Friends and Family: Not on file    Frequency of Social Gatherings with Friends and Family: Not on file    Attends Religious Services: Never    Active Member of Clubs or  Organizations: No    Attends Banker Meetings: Never    Marital Status: Married  Catering manager Violence: Not At Risk (07/25/2018)   Humiliation, Afraid, Rape, and Kick questionnaire    Fear of Current or Ex-Partner: No    Emotionally Abused: No    Physically Abused: No    Sexually Abused: No    Family History  Problem Relation Age of Onset   Alcohol abuse Mother    Epilepsy Paternal Grandmother    Epilepsy Brother     Allergies  Allergen Reactions   Morphine And Codeine Hives and Other (See Comments)   Sulfa Antibiotics Hives and Itching   Vicodin [Hydrocodone-Acetaminophen] Hives   Penicillins  Other (See Comments) and Rash    Does not work for patient    Review of Systems  Constitutional:  Positive for chills, fever and malaise/fatigue.  HENT:  Positive for congestion, rhinorrhea and sinus pain. Negative for sore throat.        Hoarseness  Gastrointestinal:  Negative for nausea.  Musculoskeletal:  Positive for back pain.  Neurological:  Positive for headaches.  All other systems reviewed and are negative.      Objective:   BP (!) 140/95   Pulse 79   Ht 5\' 5"  (1.651 m)   Wt 226 lb 6.4 oz (102.7 kg)   SpO2 98%   BMI 37.67 kg/m   Vitals:   04/22/23 1325  BP: (!) 140/95  Pulse: 79  Height: 5\' 5"  (1.651 m)  Weight: 226 lb 6.4 oz (102.7 kg)  SpO2: 98%  BMI (Calculated): 37.67    Physical Exam Vitals and nursing note reviewed.  Constitutional:      General: She is not in acute distress.    Appearance: Normal appearance. She is obese. She is ill-appearing. She is not toxic-appearing.  HENT:     Head: Normocephalic.     Nose: Nasal tenderness, mucosal edema, congestion and rhinorrhea present.     Right Turbinates: Swollen.     Left Turbinates: Swollen.     Right Sinus: Maxillary sinus tenderness and frontal sinus tenderness present.     Left Sinus: Maxillary sinus tenderness and frontal sinus tenderness present.     Mouth/Throat:     Pharynx: Posterior oropharyngeal erythema present.  Eyes:     Extraocular Movements: Extraocular movements intact.     Conjunctiva/sclera: Conjunctivae normal.     Pupils: Pupils are equal, round, and reactive to light.  Cardiovascular:     Rate and Rhythm: Normal rate.  Pulmonary:     Effort: Pulmonary effort is normal.  Skin:    General: Skin is warm.  Neurological:     General: No focal deficit present.     Mental Status: She is alert and oriented to person, place, and time. Mental status is at baseline.  Psychiatric:        Mood and Affect: Mood normal.        Behavior: Behavior normal.        Thought Content:  Thought content normal.        Judgment: Judgment normal.      Results for orders placed or performed in visit on 04/22/23  POCT XPERT XPRESS SARS COVID-2/FLU/RSV  Result Value Ref Range   SARS Coronavirus 2 Negative    FLU A negative    FLU B negative    RSV RNA, PCR negative     Recent Results (from the past 2160 hour(s))  POCT XPERT XPRESS SARS COVID-2/FLU/RSV  Status: None   Collection Time: 04/22/23  2:47 PM  Result Value Ref Range   SARS Coronavirus 2 Negative    FLU A negative    FLU B negative    RSV RNA, PCR negative        Assessment & Plan:   Problem List Items Addressed This Visit   None Visit Diagnoses     Upper respiratory infection, acute    -  Primary   Running COVID test today in office.  Will send meds based on results.   Relevant Medications   azithromycin (ZITHROMAX) 250 MG tablet   Other Relevant Orders   POCT XPERT XPRESS SARS COVID-2/FLU/RSV (Completed)   Myalgia       Running COVID test today in office.  Will send meds based on results.   Relevant Orders   POCT XPERT XPRESS SARS COVID-2/FLU/RSV (Completed)   Suspected 2019 novel coronavirus infection       Relevant Orders   POCT XPERT XPRESS SARS COVID-2/FLU/RSV (Completed)       Return if symptoms worsen or fail to improve.   Total time spent: 20 minutes  Miki Kins, FNP  04/22/2023   This document may have been prepared by [facility] Voice Recognition software and as such may include unintentional dictation errors.

## 2023-06-02 ENCOUNTER — Encounter: Payer: Self-pay | Admitting: Family

## 2023-06-02 ENCOUNTER — Ambulatory Visit: Payer: 59 | Admitting: Family

## 2023-06-02 VITALS — BP 150/100 | Ht 65.0 in | Wt 226.4 lb

## 2023-06-02 DIAGNOSIS — J069 Acute upper respiratory infection, unspecified: Secondary | ICD-10-CM | POA: Diagnosis not present

## 2023-06-02 MED ORDER — AZITHROMYCIN 250 MG PO TABS: ORAL_TABLET | ORAL | 0 refills | Status: AC

## 2023-06-08 ENCOUNTER — Encounter: Payer: Self-pay | Admitting: Family

## 2023-06-27 ENCOUNTER — Encounter: Payer: Self-pay | Admitting: Family

## 2023-06-29 ENCOUNTER — Encounter: Payer: Self-pay | Admitting: Family

## 2023-06-29 ENCOUNTER — Ambulatory Visit: Payer: 59 | Admitting: Family

## 2023-06-29 VITALS — BP 120/84 | HR 79 | Ht 65.0 in | Wt 226.2 lb

## 2023-06-29 DIAGNOSIS — J069 Acute upper respiratory infection, unspecified: Secondary | ICD-10-CM

## 2023-06-29 DIAGNOSIS — Z0130 Encounter for examination of blood pressure without abnormal findings: Secondary | ICD-10-CM

## 2023-06-29 LAB — POCT XPERT XPRESS SARS COVID-2/FLU/RSV
FLU A: NEGATIVE
FLU B: NEGATIVE
RSV RNA, PCR: NEGATIVE
SARS Coronavirus 2: NEGATIVE

## 2023-06-30 ENCOUNTER — Telehealth: Payer: Self-pay | Admitting: Family

## 2023-06-30 ENCOUNTER — Ambulatory Visit: Payer: 59 | Admitting: Family

## 2023-06-30 NOTE — Telephone Encounter (Signed)
Patient called in wanting to know if [name] is going to send her in abx. She was seen yesterday in office.  [facility]

## 2023-07-02 MED ORDER — LEVOFLOXACIN 500 MG PO TABS: 500.0000 mg | ORAL_TABLET | Freq: Every day | ORAL | 0 refills | Status: AC

## 2023-07-02 NOTE — Telephone Encounter (Signed)
Kristin Vargas has sent rx

## 2023-08-08 ENCOUNTER — Encounter: Payer: Self-pay | Admitting: Family

## 2023-08-08 NOTE — Progress Notes (Signed)
Established Patient Office Visit  Subjective:  Patient ID: Kristin Vargas, female    DOB: 04/29/1976  Age: 47 y.o. MRN: [id number]  Chief Complaint  Patient presents with   URI    URI  This is a new problem. The current episode started in the past 7 days. The problem has been gradually worsening. The maximum temperature recorded prior to her arrival was 101 - 101.9 F. The fever has been present for 1 to 2 days. Associated symptoms include congestion, coughing, rhinorrhea and sinus pain. She has tried steam, sleep, NSAIDs, increased fluids, eating, antihistamine, decongestant and acetaminophen for the symptoms. The treatment provided no relief.    No other concerns at this time.   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   Impingement of anterior part of ankle 04/11/2019   Peroneal tendinitis 04/11/2019   Strain of peroneal tendon 04/11/2019   Stress fracture of metatarsal bone 08/03/2019   Synovitis of ankle 01/01/2020    Past Surgical History:  Procedure Laterality Date   APPENDECTOMY      Social History   Socioeconomic History   Marital status: Married    Spouse name: albano   Number of children: 1   Years of education: Not on file   Highest education level: GED or equivalent  Occupational History   Not on file  Tobacco Use   Smoking status: Former    Current packs/day: 0.00    Types: Cigarettes    Start date: 85    Quit date: 07/25/2000    Years since quitting: 23.0   Smokeless tobacco: Never  Vaping Use   Vaping status: Never Used  Substance and Sexual Activity   Alcohol use: Never   Drug use: Never   Sexual activity: Yes    Birth control/protection: None  Other Topics Concern   Not on file  Social History Narrative   Not on file   Social Determinants of Health   Financial Resource Strain: Low Risk  (07/25/2018)   Overall Financial Resource Strain  (CARDIA)    Difficulty of Paying Living Expenses: Not hard at all  Food Insecurity: No Food Insecurity (07/25/2018)   Hunger Vital Sign    Worried About Running Out of Food in the Last Year: Never true    Ran Out of Food in the Last Year: Never true  Transportation Needs: No Transportation Needs (07/25/2018)   PRAPARE - Administrator, Civil Service (Medical): No    Lack of Transportation (Non-Medical): No  Physical Activity: Inactive (07/25/2018)   Exercise Vital Sign    Days of Exercise per Week: 0 days    Minutes of Exercise per Session: 0 min  Stress: Stress Concern Present (07/25/2018)   [employer] of Occupational Health - Occupational Stress Questionnaire    Feeling of Stress : To some extent  Social Connections: Unknown (07/25/2018)   Social Connection and Isolation Panel [NHANES]    Frequency of Communication with Friends and Family: Not on file    Frequency of Social Gatherings with Friends and Family: Not on file    Attends Religious Services: Never    Active Member of Clubs or Organizations: No    Attends Banker Meetings: Never    Marital Status: Married  Catering manager Violence: Not At Risk (07/25/2018)   Humiliation, Afraid, Rape, and Kick questionnaire    Fear of Current or Ex-Partner: No  Emotionally Abused: No    Physically Abused: No    Sexually Abused: No    Family History  Problem Relation Age of Onset   Alcohol abuse Mother    Epilepsy Paternal Grandmother    Epilepsy Brother     Allergies  Allergen Reactions   Morphine And Codeine Hives and Other (See Comments)   Sulfa Antibiotics Hives and Itching   Vicodin [Hydrocodone-Acetaminophen] Hives   Penicillins Other (See Comments) and Rash    Does not work for patient    Review of Systems  HENT:  Positive for congestion, rhinorrhea and sinus pain.   Respiratory:  Positive for cough and sputum production.        Objective:   BP 120/84   Pulse 79   Ht 5\' 5"   (1.651 m)   Wt 226 lb 3.2 oz (102.6 kg)   SpO2 97%   BMI 37.64 kg/m   Vitals:   06/29/23 1141  BP: 120/84  Pulse: 79  Height: 5\' 5"  (1.651 m)  Weight: 226 lb 3.2 oz (102.6 kg)  SpO2: 97%  BMI (Calculated): 37.64    Physical Exam Vitals and nursing note reviewed.  Constitutional:      Appearance: Normal appearance. She is obese.  HENT:     Head: Normocephalic.     Nose: Congestion and rhinorrhea present.  Eyes:     Extraocular Movements: Extraocular movements intact.     Conjunctiva/sclera: Conjunctivae normal.     Pupils: Pupils are equal, round, and reactive to light.  Cardiovascular:     Rate and Rhythm: Normal rate and regular rhythm.  Pulmonary:     Effort: Pulmonary effort is normal.  Neurological:     General: No focal deficit present.     Mental Status: She is alert and oriented to person, place, and time. Mental status is at baseline.  Psychiatric:        Mood and Affect: Mood normal.        Behavior: Behavior normal.        Thought Content: Thought content normal.        Judgment: Judgment normal.      Results for orders placed or performed in visit on 06/29/23  POCT XPERT XPRESS SARS COVID-2/FLU/RSV [[id number]]  Result Value Ref Range   SARS Coronavirus 2 neg    FLU A neg    FLU B neg    RSV RNA, PCR neg     Recent Results (from the past 2160 hour(s))  POCT XPERT XPRESS SARS COVID-2/FLU/RSV [[id number]]     Status: None   Collection Time: 06/29/23 12:46 PM  Result Value Ref Range   SARS Coronavirus 2 neg    FLU A neg    FLU B neg    RSV RNA, PCR neg        Assessment & Plan:   Problem List Items Addressed This Visit   None Visit Diagnoses     Upper respiratory tract infection, unspecified type    -  Primary   COVID/Flu/RSV test in office today negative.  Sending RX for antibiotics.   Relevant Orders   POCT XPERT XPRESS SARS COVID-2/FLU/RSV [[id number]] (Completed)       Return as scheduled unless not improving..   Total time  spent: 20 minutes  Miki Kins, FNP  06/29/2023   This document may have been prepared by [facility] Voice Recognition software and as such may include unintentional dictation errors.

## 2023-08-09 ENCOUNTER — Ambulatory Visit: Payer: 59 | Admitting: Cardiology

## 2023-08-09 ENCOUNTER — Encounter: Payer: Self-pay | Admitting: Cardiology

## 2023-08-09 ENCOUNTER — Other Ambulatory Visit: Payer: Self-pay

## 2023-08-09 ENCOUNTER — Ambulatory Visit
Admission: RE | Admit: 2023-08-09 | Discharge: 2023-08-09 | Disposition: A | Discharge status: Home / Self Care | Payer: 59 | Source: Ambulatory Visit | Attending: Cardiology | Admitting: Cardiology

## 2023-08-09 ENCOUNTER — Ambulatory Visit
Admission: RE | Admit: 2023-08-09 | Discharge: 2023-08-09 | Disposition: A | Discharge status: Home / Self Care | Payer: 59 | Source: Home / Self Care | Attending: Cardiology | Admitting: Cardiology

## 2023-08-09 ENCOUNTER — Emergency Department
Admission: EM | Admit: 2023-08-09 | Discharge: 2023-08-09 | Disposition: A | Discharge status: Home / Self Care | Payer: 59 | Attending: Student in an Organized Health Care Education/Training Program | Admitting: Student in an Organized Health Care Education/Training Program

## 2023-08-09 VITALS — BP 132/86 | HR 69 | Ht 65.0 in | Wt 224.4 lb

## 2023-08-09 DIAGNOSIS — M7712 Lateral epicondylitis, left elbow: Secondary | ICD-10-CM | POA: Insufficient documentation

## 2023-08-09 DIAGNOSIS — M25522 Pain in left elbow: Secondary | ICD-10-CM | POA: Insufficient documentation

## 2023-08-09 DIAGNOSIS — J45909 Unspecified asthma, uncomplicated: Secondary | ICD-10-CM | POA: Insufficient documentation

## 2023-08-09 MED ORDER — IBUPROFEN 800 MG PO TABS: 800.0000 mg | ORAL_TABLET | Freq: Three times a day (TID) | ORAL | 3 refills | Status: DC | PRN

## 2023-08-09 NOTE — Progress Notes (Signed)
Established Patient Office Visit  Subjective:  Patient ID: Kristin Vargas, female    DOB: [birth year]  Age: 47 y.o. MRN: [id number]  Chief Complaint  Patient presents with   Acute Visit    Left elbow pain, possible infection.    Patient in office for an acute visit, complaining of left elbow pain. Patient reports being scratched by a dog a couple of months ago. No pain that time. Patient states she noticed approximately 2 weeks ago, her left elbow hurting, decrease muscle strength. Has been taking ibuprofen 800 mg with some relief. Will xray elbow to check for acute changes. Continue ibuprofen 800 mg as directed. 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     No other concerns at this time.   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   Impingement of anterior part of ankle 04/11/2019   Peroneal tendinitis 04/11/2019   Strain of peroneal tendon 04/11/2019   Stress fracture of metatarsal bone 08/03/2019   Synovitis of ankle 01/01/2020    Past Surgical History:  Procedure Laterality Date   APPENDECTOMY      Social History   Socioeconomic History   Marital status: Married    Spouse name: albano   Number of children: 1   Years of education: Not on file   Highest education level: GED or equivalent  Occupational History   Not on file  Tobacco Use   Smoking status: Former    Current packs/day: 0.00    Types: Cigarettes    Start date: 2    Quit date: 07/25/2000    Years since quitting: 23.0   Smokeless tobacco: Never  Vaping Use   Vaping status: Never Used  Substance and Sexual Activity   Alcohol use: Never   Drug use: Never   Sexual activity: Yes    Birth control/protection: None  Other Topics Concern   Not on file  Social History Narrative   Not on file   Social Determinants of Health   Financial Resource Strain: Low Risk   (07/25/2018)   Overall Financial Resource Strain (CARDIA)    Difficulty of Paying Living Expenses: Not hard at all  Food Insecurity: No Food Insecurity (07/25/2018)   Hunger Vital Sign    Worried About Running Out of Food in the Last Year: Never true    Ran Out of Food in the Last Year: Never true  Transportation Needs: No Transportation Needs (07/25/2018)   PRAPARE - Administrator, Civil Service (Medical): No    Lack of Transportation (Non-Medical): No  Physical Activity: Inactive (07/25/2018)   Exercise Vital Sign    Days of Exercise per Week: 0 days    Minutes of Exercise per Session: 0 min  Stress: Stress Concern Present (07/25/2018)   [employer] of Occupational Health - Occupational Stress Questionnaire    Feeling of Stress : To some extent  Social Connections: Unknown (07/25/2018)   Social Connection and Isolation Panel [NHANES]    Frequency of Communication with Friends and Family: Not on file    Frequency of Social Gatherings with Friends and Family: Not on file    Attends Religious Services: Never    Active Member of Clubs or Organizations: No    Attends Banker Meetings: Never    Marital Status: Married  Catering manager Violence: Not At Risk (07/25/2018)   Humiliation, Afraid, Rape,  and Kick questionnaire    Fear of Current or Ex-Partner: No    Emotionally Abused: No    Physically Abused: No    Sexually Abused: No    Family History  Problem Relation Age of Onset   Alcohol abuse Mother    Epilepsy Paternal Grandmother    Epilepsy Brother     Allergies  Allergen Reactions   Morphine And Codeine Hives and Other (See Comments)   Sulfa Antibiotics Hives and Itching   Vicodin [Hydrocodone-Acetaminophen] Hives   Penicillins Other (See Comments) and Rash    Does not work for patient    Outpatient Medications Prior to Visit  Medication Sig   cholecalciferol (VITAMIN D) 25 MCG (1000 UT) tablet Take 1,000 Units by mouth daily.    Multiple Vitamin (MULTIVITAMIN WITH MINERALS) TABS tablet Take 1 tablet by mouth daily.   [DISCONTINUED] ibuprofen (ADVIL) 800 MG tablet Take 800 mg by mouth every 8 (eight) hours as needed.   No facility-administered medications prior to visit.    Review of Systems  Constitutional: Negative.   HENT: Negative.    Eyes: Negative.   Respiratory: Negative.  Negative for shortness of breath.   Cardiovascular: Negative.  Negative for chest pain.  Gastrointestinal: Negative.  Negative for abdominal pain, constipation and diarrhea.  Genitourinary: Negative.   Musculoskeletal:  Positive for joint pain. Negative for myalgias.  Skin: Negative.   Neurological: Negative.  Negative for dizziness and headaches.  Endo/Heme/Allergies: Negative.   All other systems reviewed and are negative.      Objective:   BP 132/86   Pulse 69   Ht 5\' 5"  (1.651 m)   Wt 224 lb 6.4 oz (101.8 kg)   SpO2 100%   BMI 37.34 kg/m   Vitals:   08/09/23 1056  BP: 132/86  Pulse: 69  Height: 5\' 5"  (1.651 m)  Weight: 224 lb 6.4 oz (101.8 kg)  SpO2: 100%  BMI (Calculated): 37.34    Physical Exam Vitals and nursing note reviewed.  Constitutional:      Appearance: Normal appearance. She is normal weight.  HENT:     Head: Normocephalic and atraumatic.     Nose: Nose normal.     Mouth/Throat:     Mouth: Mucous membranes are moist.  Eyes:     Extraocular Movements: Extraocular movements intact.     Conjunctiva/sclera: Conjunctivae normal.     Pupils: Pupils are equal, round, and reactive to light.  Cardiovascular:     Rate and Rhythm: Normal rate and regular rhythm.     Pulses: Normal pulses.     Heart sounds: Normal heart sounds.  Pulmonary:     Effort: Pulmonary effort is normal.     Breath sounds: Normal breath sounds.  Abdominal:     General: Abdomen is flat. Bowel sounds are normal.     Palpations: Abdomen is soft.  Musculoskeletal:        General: Normal range of motion.     Cervical back:  Normal range of motion.  Skin:    General: Skin is warm and dry.  Neurological:     General: No focal deficit present.     Mental Status: She is alert and oriented to person, place, and time.  Psychiatric:        Mood and Affect: Mood normal.        Behavior: Behavior normal.        Thought Content: Thought content normal.        Judgment: Judgment normal.  No results found for any visits on 08/09/23.  Recent Results (from the past 2160 hour(s))  POCT XPERT XPRESS SARS COVID-2/FLU/RSV [[id number]]     Status: None   Collection Time: 06/29/23 12:46 PM  Result Value Ref Range   SARS Coronavirus 2 neg    FLU A neg    FLU B neg    RSV RNA, PCR neg       Assessment & Plan:  Xray left elbow Ibuprofen  Problem List Items Addressed This Visit       Other   Left elbow pain - Primary   Relevant Orders   DG Elbow Complete Left    Return if symptoms worsen or fail to improve.   Total time spent: 25 minutes  [employer], NP  08/09/2023   This document may have been prepared by Dragon Voice Recognition software and as such may include unintentional dictation errors.

## 2023-08-09 NOTE — Discharge Instructions (Addendum)
Your x-rays looked normal, so I feel this is lateral epicondylitis or "tennis elbow".  Please take ibuprofen every 8 hours as needed for pain.  You can ice your elbow as needed as well.  I also encourage you to go to physical therapy.  I have attached information about tennis elbow as well as some exercises you could try.

## 2023-08-09 NOTE — ED Provider Notes (Signed)
[facility] Provider Note    Event Date/Time   First MD Initiated Contact with Patient 08/09/23 1550     (approximate)   History   Elbow Pain   HPI  Kristin Vargas is a 47 y.o. female with PMH of asthma, anxiety, neuropathic pain, left elbow pain presents for evaluation of left elbow pain and possible wound infection.  Patient states in October she was scratched on her left elbow by her dog's dog who lives only outside.  She cleaned the wound well with alcohol and applied Neosporin to it.  Patient was seen this morning at primary care and diagnosed with lateral epicondylitis.  She was sent for x-ray and is going to begin physical therapy.  Patient presents to the ED because she feels like her concerns were not heard at the primary care office.      Physical Exam   Triage Vital Signs: ED Triage Vitals [08/09/23 1448]  Encounter Vitals Group     BP      Systolic BP Percentile      Diastolic BP Percentile      Pulse      Resp      Temp      Temp src      SpO2      Weight      Height      Head Circumference      Peak Flow      Pain Score 8     Pain Loc      Pain Education      Exclude from Growth Chart     Most recent vital signs: Vitals:   08/09/23 1653  BP: (!) 142/86  Pulse: 66  Resp: 16  Temp: 98.6 F (37 C)  SpO2: 98%    General: Awake, no distress.  CV:  Good peripheral perfusion.  Resp:  Normal effort.  Abd:  No distention.  Other:  No wound to the left elbow, no swelling when compared to the right, no overlying skin changes, warmth or erythema.  Tender to palpation over the lateral epicondyles, ROM maintained but does elicit some pain when fully extended and with pronation, 5/5 strength bilaterally but does cause some pain.  Radial pulse 2+ and regular, sensation intact across all dermatomes.   ED Results / Procedures / Treatments   Labs (all labs ordered are listed, but only abnormal results are displayed) Labs Reviewed - No  data to display   PROCEDURES:  Critical Care performed: No  Procedures   MEDICATIONS ORDERED IN ED: Medications - No data to display   IMPRESSION / MDM / ASSESSMENT AND PLAN / ED COURSE  I reviewed the triage vital signs and the nursing notes.                             47 year old female presents for evaluation of left elbow pain.  Patient was hypertensive otherwise vital signs stable.  Patient NAD on exam.  Differential diagnosis includes, but is not limited to, muscle strain, elbow fracture, elbow dislocation, lateral epicondylitis, wound infection, joint effusion.  Patient's presentation is most consistent with acute, uncomplicated illness.  I did not repeat elbow x-rays as patient had them completed this morning.  I reviewed them in her chart and did not see any acute abnormalities.  There is no evidence of joint effusion, dislocation or fracture.  Given that patient is tender over the lateral epicondyle, I do  feel that this is lateral epicondylitis.  Ace wrap was applied.  I will encourage patient to go to physical therapy as well as take ibuprofen.  She voiced understanding, all questions were answered and she was stable at discharge.   FINAL CLINICAL IMPRESSION(S) / ED DIAGNOSES   Final diagnoses:  Lateral epicondylitis of left elbow     Rx / DC Orders   ED Discharge Orders     None        Note:  This document was prepared using Dragon voice recognition software and may include unintentional dictation errors.   Cameron Ali, PA-C 08/09/23 1657    Willy Eddy, MD 08/09/23 1714

## 2023-08-09 NOTE — ED Triage Notes (Signed)
Pt to ED via POV from home. Pt reports left elbow pain x2 wks. Pt seen by PCP and dx with tennis elbow and sent for xray. Pt states 2wks ago she was scratched by a dog that caused a deep gash. Pt reports concerned for severe infection. Pt states lifted something today and felt sever pain shooting down arm.

## 2023-08-11 ENCOUNTER — Ambulatory Visit: Payer: 59 | Admitting: Family

## 2023-08-11 ENCOUNTER — Encounter: Payer: Self-pay | Admitting: Family

## 2023-08-11 VITALS — BP 130/80 | HR 74 | Ht 65.0 in | Wt 223.0 lb

## 2023-08-11 DIAGNOSIS — R5383 Other fatigue: Secondary | ICD-10-CM | POA: Diagnosis not present

## 2023-08-11 DIAGNOSIS — E538 Deficiency of other specified B group vitamins: Secondary | ICD-10-CM | POA: Diagnosis not present

## 2023-08-11 DIAGNOSIS — R7303 Prediabetes: Secondary | ICD-10-CM

## 2023-08-11 DIAGNOSIS — E782 Mixed hyperlipidemia: Secondary | ICD-10-CM

## 2023-08-11 DIAGNOSIS — E559 Vitamin D deficiency, unspecified: Secondary | ICD-10-CM

## 2023-08-11 DIAGNOSIS — I10 Essential (primary) hypertension: Secondary | ICD-10-CM | POA: Diagnosis not present

## 2023-08-11 DIAGNOSIS — M25522 Pain in left elbow: Secondary | ICD-10-CM

## 2023-08-11 DIAGNOSIS — Z1159 Encounter for screening for other viral diseases: Secondary | ICD-10-CM

## 2023-08-11 NOTE — Progress Notes (Signed)
Acute Office Visit  Subjective:     Patient ID: Kristin Vargas, female    DOB: Feb 08, 1976, 47 y.o.   MRN: [id number]  Patient is in today for  Chief Complaint  Patient presents with   Elbow Injury    Elbow 2nd opinion    Elbow pain  Patient was here last week to discuss elbow pain, and was told that she has tennis elbow.  She was not happy with this, and went to the emergency room that night, where she says they did not even look at her elbow.  She is concerned that it was possible she might have an infection in her elbow, as she was scratched by her dog.  The scratch occurred several weeks ago, but she is still concerned that this might have caused her issues.      Review of Systems  Musculoskeletal:  Positive for joint pain (elbow pain).  Skin:        Lesion on left arm  All other systems reviewed and are negative.       Objective:    BP 130/80   Pulse 74   Ht 5\' 5"  (1.651 m)   Wt 223 lb (101.2 kg)   LMP 08/07/2023   SpO2 99%   BMI 37.11 kg/m   Physical Exam Vitals and nursing note reviewed.  Constitutional:      Appearance: Normal appearance. She is obese.  HENT:     Head: Normocephalic.  Eyes:     Extraocular Movements: Extraocular movements intact.     Conjunctiva/sclera: Conjunctivae normal.     Pupils: Pupils are equal, round, and reactive to light.  Cardiovascular:     Rate and Rhythm: Normal rate.  Pulmonary:     Effort: Pulmonary effort is normal.  Neurological:     General: No focal deficit present.     Mental Status: She is alert and oriented to person, place, and time. Mental status is at baseline.  Psychiatric:        Mood and Affect: Mood normal.        Behavior: Behavior normal.        Thought Content: Thought content normal.     Results for orders placed or performed in visit on 08/11/23  CBC with Diff  Result Value Ref Range   WBC 7.2 3.4 - 10.8 x10E3/uL   RBC 4.97 3.77 - 5.28 x10E6/uL   Hemoglobin 13.2 11.1 - 15.9 g/dL    Hematocrit 40.9 81.1 - 46.6 %   MCV 85 79 - 97 fL   MCH 26.6 26.6 - 33.0 pg   MCHC 31.4 (L) 31.5 - 35.7 g/dL   RDW 91.4 78.2 - 95.6 %   Platelets 345 150 - 450 x10E3/uL   Neutrophils 59 Not Estab. %   Lymphs 29 Not Estab. %   Monocytes 8 Not Estab. %   Eos 3 Not Estab. %   Basos 1 Not Estab. %   Neutrophils Absolute 4.3 1.4 - 7.0 x10E3/uL   Lymphocytes Absolute 2.1 0.7 - 3.1 x10E3/uL   Monocytes Absolute 0.6 0.1 - 0.9 x10E3/uL   EOS (ABSOLUTE) 0.2 0.0 - 0.4 x10E3/uL   Basophils Absolute 0.1 0.0 - 0.2 x10E3/uL   Immature Granulocytes 0 Not Estab. %   Immature Grans (Abs) 0.0 0.0 - 0.1 x10E3/uL  Lipid panel  Result Value Ref Range   Cholesterol, Total 158 100 - 199 mg/dL   Triglycerides 91 0 - 149 mg/dL   HDL 51 >21 mg/dL  VLDL Cholesterol Cal 17 5 - 40 mg/dL   LDL Chol Calc (NIH) 90 0 - 99 mg/dL   Chol/HDL Ratio 3.1 0.0 - 4.4 ratio  VITAMIN D 25 Hydroxy (Vit-D Deficiency, Fractures)  Result Value Ref Range   Vit D, 25-Hydroxy 43.9 30.0 - 100.0 ng/mL  CMP14+EGFR  Result Value Ref Range   Glucose 80 70 - 99 mg/dL   BUN 11 6 - 24 mg/dL   Creatinine, Ser 7.84 0.57 - 1.00 mg/dL   eGFR 88 >69 GE/XBM/8.41   BUN/Creatinine Ratio 13 9 - 23   Sodium 138 134 - 144 mmol/L   Potassium 4.8 3.5 - 5.2 mmol/L   Chloride 103 96 - 106 mmol/L   CO2 22 20 - 29 mmol/L   Calcium 9.4 8.7 - 10.2 mg/dL   Total Protein 6.9 6.0 - 8.5 g/dL   Albumin 4.4 3.9 - 4.9 g/dL   Globulin, Total 2.5 1.5 - 4.5 g/dL   Bilirubin Total 0.4 0.0 - 1.2 mg/dL   Alkaline Phosphatase 87 44 - 121 IU/L   AST 21 0 - 40 IU/L   ALT 18 0 - 32 IU/L  TSH  Result Value Ref Range   TSH 1.290 0.450 - 4.500 uIU/mL  Hemoglobin A1c  Result Value Ref Range   Hgb A1c MFr Bld 5.4 4.8 - 5.6 %   Est. average glucose Bld gHb Est-mCnc 108 mg/dL  Vitamin L24  Result Value Ref Range   Vitamin B-12 567 232 - 1,245 pg/mL  Hepatitis C Ab reflex to Quant PCR  Result Value Ref Range   HCV Ab Non Reactive Non Reactive   Interpretation:  Result Value Ref Range   HCV Interp 1: Comment     Recent Results (from the past 2160 hour(s))  POCT XPERT XPRESS SARS COVID-2/FLU/RSV [[id number]]     Status: None   Collection Time: 06/29/23 12:46 PM  Result Value Ref Range   SARS Coronavirus 2 neg    FLU A neg    FLU B neg    RSV RNA, PCR neg   CBC with Diff     Status: Abnormal   Collection Time: 08/11/23 11:46 AM  Result Value Ref Range   WBC 7.2 3.4 - 10.8 x10E3/uL    Comment: **Effective August 16, 2023 profile 005025 WBC will be made**   non-orderable as a stand-alone order code.    RBC 4.97 3.77 - 5.28 x10E6/uL   Hemoglobin 13.2 11.1 - 15.9 g/dL   Hematocrit 66.4 40.3 - 46.6 %   MCV 85 79 - 97 fL   MCH 26.6 26.6 - 33.0 pg   MCHC 31.4 (L) 31.5 - 35.7 g/dL   RDW 47.4 25.9 - 56.3 %   Platelets 345 150 - 450 x10E3/uL   Neutrophils 59 Not Estab. %   Lymphs 29 Not Estab. %   Monocytes 8 Not Estab. %   Eos 3 Not Estab. %   Basos 1 Not Estab. %   Neutrophils Absolute 4.3 1.4 - 7.0 x10E3/uL   Lymphocytes Absolute 2.1 0.7 - 3.1 x10E3/uL   Monocytes Absolute 0.6 0.1 - 0.9 x10E3/uL   EOS (ABSOLUTE) 0.2 0.0 - 0.4 x10E3/uL   Basophils Absolute 0.1 0.0 - 0.2 x10E3/uL   Immature Granulocytes 0 Not Estab. %   Immature Grans (Abs) 0.0 0.0 - 0.1 x10E3/uL  Lipid panel     Status: None   Collection Time: 08/11/23 11:46 AM  Result Value Ref Range   Cholesterol, Total 158 100 - 199  mg/dL   Triglycerides 91 0 - 149 mg/dL   HDL 51 >18 mg/dL   VLDL Cholesterol Cal 17 5 - 40 mg/dL   LDL Chol Calc (NIH) 90 0 - 99 mg/dL   Chol/HDL Ratio 3.1 0.0 - 4.4 ratio    Comment:                                   T. Chol/HDL Ratio                                             Men  Women                               1/2 Avg.Risk  3.4    3.3                                   Avg.Risk  5.0    4.4                                2X Avg.Risk  9.6    7.1                                3X Avg.Risk 23.4   11.0   VITAMIN D 25 Hydroxy  (Vit-D Deficiency, Fractures)     Status: None   Collection Time: 08/11/23 11:46 AM  Result Value Ref Range   Vit D, 25-Hydroxy 43.9 30.0 - 100.0 ng/mL    Comment: Vitamin D deficiency has been defined by the Institute of Medicine and an Endocrine Society practice guideline as a level of serum 25-OH vitamin D less than 20 ng/mL (1,2). The Endocrine Society went on to further define vitamin D insufficiency as a level between 21 and 29 ng/mL (2). 1. IOM (Institute of Medicine). 2010. Dietary reference    intakes for calcium and D. [place]: The    [employer]. 2. Holick MF, Binkley [place], Bischoff-Ferrari HA, et al.    Evaluation, treatment, and prevention of vitamin D    deficiency: an Endocrine Society clinical practice    guideline. JCEM. 2011 Jul; 96(7):1911-30.   CMP14+EGFR     Status: None   Collection Time: 08/11/23 11:46 AM  Result Value Ref Range   Glucose 80 70 - 99 mg/dL   BUN 11 6 - 24 mg/dL   Creatinine, Ser 8.41 0.57 - 1.00 mg/dL   eGFR 88 >66 AY/TKZ/6.01   BUN/Creatinine Ratio 13 9 - 23   Sodium 138 134 - 144 mmol/L   Potassium 4.8 3.5 - 5.2 mmol/L   Chloride 103 96 - 106 mmol/L   CO2 22 20 - 29 mmol/L   Calcium 9.4 8.7 - 10.2 mg/dL   Total Protein 6.9 6.0 - 8.5 g/dL   Albumin 4.4 3.9 - 4.9 g/dL   Globulin, Total 2.5 1.5 - 4.5 g/dL   Bilirubin Total 0.4 0.0 - 1.2 mg/dL   Alkaline Phosphatase 87 44 - 121 IU/L   AST 21 0 - 40 IU/L   ALT 18 0 - 32  IU/L  TSH     Status: None   Collection Time: 08/11/23 11:46 AM  Result Value Ref Range   TSH 1.290 0.450 - 4.500 uIU/mL  Hemoglobin A1c     Status: None   Collection Time: 08/11/23 11:46 AM  Result Value Ref Range   Hgb A1c MFr Bld 5.4 4.8 - 5.6 %    Comment:          Prediabetes: 5.7 - 6.4          Diabetes: >6.4          Glycemic control for adults with diabetes: <7.0    Est. average glucose Bld gHb Est-mCnc 108 mg/dL  Vitamin Z61     Status: None   Collection Time: 08/11/23 11:46 AM  Result  Value Ref Range   Vitamin B-12 567 232 - 1,245 pg/mL  Hepatitis C Ab reflex to Quant PCR     Status: None   Collection Time: 08/11/23 11:46 AM  Result Value Ref Range   HCV Ab Non Reactive Non Reactive  Interpretation:     Status: None   Collection Time: 08/11/23 11:46 AM  Result Value Ref Range   HCV Interp 1: Comment     Comment: Not infected with HCV unless early or acute infection is suspected (which may be delayed in an immunocompromised individual), or other evidence exists to indicate HCV infection.     Allergies as of 08/11/2023       Reactions   Morphine And Codeine Hives, Other (See Comments)   Sulfa Antibiotics Hives, Itching   Vicodin [hydrocodone-acetaminophen] Hives   Penicillins Other (See Comments), Rash   Does not work for patient        Medication List        Accurate as of August 11, 2023 11:59 PM. If you have any questions, ask your nurse or doctor.          cholecalciferol 25 MCG (1000 UNIT) tablet Commonly known as: VITAMIN D3 Take 1,000 Units by mouth daily.   ibuprofen 800 MG tablet Commonly known as: ADVIL Take 1 tablet (800 mg total) by mouth every 8 (eight) hours as needed.   multivitamin with minerals Tabs tablet Take 1 tablet by mouth daily.            Assessment & Plan:   Problem List Items Addressed This Visit       Active Problems   Left elbow pain - Primary   Relevant Orders   CBC with Diff (Completed)   CMP14+EGFR (Completed)   Other Visit Diagnoses     B12 deficiency due to diet       Relevant Orders   CBC with Diff (Completed)   CMP14+EGFR (Completed)   Vitamin B12 (Completed)   Other fatigue       Relevant Orders   CBC with Diff (Completed)   CMP14+EGFR (Completed)   TSH (Completed)   Vitamin D deficiency, unspecified       Relevant Orders   CBC with Diff (Completed)   VITAMIN D 25 Hydroxy (Vit-D Deficiency, Fractures) (Completed)   CMP14+EGFR (Completed)   Prediabetes       Relevant Orders    CBC with Diff (Completed)   CMP14+EGFR (Completed)   Hemoglobin A1c (Completed)   Mixed hyperlipidemia       Relevant Orders   CBC with Diff (Completed)   Lipid panel (Completed)   CMP14+EGFR (Completed)   Essential hypertension, benign       Relevant Orders  CBC with Diff (Completed)   CMP14+EGFR (Completed)   Need for hepatitis C screening test       Relevant Orders   Hepatitis C Ab reflex to Quant PCR (Completed)        No follow-ups on file.  Total time spent: 20 minutes  Miki Kins, FNP  08/11/2023   This document may have been prepared by [facility] Voice Recognition software and as such may include unintentional dictation errors.

## 2023-08-12 LAB — VITAMIN B12: Vitamin B-12: 567 pg/mL (ref 232–1245)

## 2023-08-12 LAB — CMP14+EGFR
ALT: 18 [IU]/L (ref 0–32)
AST: 21 [IU]/L (ref 0–40)
Albumin: 4.4 g/dL (ref 3.9–4.9)
Alkaline Phosphatase: 87 [IU]/L (ref 44–121)
BUN/Creatinine Ratio: 13 (ref 9–23)
BUN: 11 mg/dL (ref 6–24)
Bilirubin Total: 0.4 mg/dL (ref 0.0–1.2)
CO2: 22 mmol/L (ref 20–29)
Calcium: 9.4 mg/dL (ref 8.7–10.2)
Chloride: 103 mmol/L (ref 96–106)
Creatinine, Ser: 0.82 mg/dL (ref 0.57–1.00)
Globulin, Total: 2.5 g/dL (ref 1.5–4.5)
Glucose: 80 mg/dL (ref 70–99)
Potassium: 4.8 mmol/L (ref 3.5–5.2)
Sodium: 138 mmol/L (ref 134–144)
Total Protein: 6.9 g/dL (ref 6.0–8.5)
eGFR: 88 mL/min/{1.73_m2} (ref >59)

## 2023-08-12 LAB — LIPID PANEL
Chol/HDL Ratio: 3.1 {ratio} (ref 0.0–4.4)
Cholesterol, Total: 158 mg/dL (ref 100–199)
HDL: 51 mg/dL (ref >39)
LDL Chol Calc (NIH): 90 mg/dL (ref 0–99)
Triglycerides: 91 mg/dL (ref 0–149)
VLDL Cholesterol Cal: 17 mg/dL (ref 5–40)

## 2023-08-12 LAB — CBC WITH DIFFERENTIAL/PLATELET
Basophils Absolute: 0.1 10*3/uL (ref 0.0–0.2)
Basos: 1 %
EOS (ABSOLUTE): 0.2 10*3/uL (ref 0.0–0.4)
Eos: 3 %
Hematocrit: 42 % (ref 34.0–46.6)
Hemoglobin: 13.2 g/dL (ref 11.1–15.9)
Immature Grans (Abs): 0 10*3/uL (ref 0.0–0.1)
Immature Granulocytes: 0 %
Lymphocytes Absolute: 2.1 10*3/uL (ref 0.7–3.1)
Lymphs: 29 %
MCH: 26.6 pg (ref 26.6–33.0)
MCHC: 31.4 g/dL — ABNORMAL LOW (ref 31.5–35.7)
MCV: 85 fL (ref 79–97)
Monocytes Absolute: 0.6 10*3/uL (ref 0.1–0.9)
Monocytes: 8 %
Neutrophils Absolute: 4.3 10*3/uL (ref 1.4–7.0)
Neutrophils: 59 %
Platelets: 345 10*3/uL (ref 150–450)
RBC: 4.97 x10E6/uL (ref 3.77–5.28)
RDW: 13.9 % (ref 11.7–15.4)
WBC: 7.2 10*3/uL (ref 3.4–10.8)

## 2023-08-12 LAB — HEMOGLOBIN A1C
Est. average glucose Bld gHb Est-mCnc: 108 mg/dL
Hgb A1c MFr Bld: 5.4 % (ref 4.8–5.6)

## 2023-08-12 LAB — VITAMIN D 25 HYDROXY (VIT D DEFICIENCY, FRACTURES): Vit D, 25-Hydroxy: 43.9 ng/mL (ref 30.0–100.0)

## 2023-08-12 LAB — HCV AB W REFLEX TO QUANT PCR: HCV Ab: NONREACTIVE

## 2023-08-12 LAB — TSH: TSH: 1.29 u[IU]/mL (ref 0.450–4.500)

## 2023-08-12 LAB — HCV INTERPRETATION

## 2023-08-20 ENCOUNTER — Telehealth: Payer: Self-pay | Admitting: Family

## 2023-08-20 NOTE — Telephone Encounter (Signed)
Patient left VM that she would like her lab results from when she saw [name] last week and would like to know what the next steps are for her elbow because now the pain is radiating down her arm into her wrist. Please advise.

## 2023-10-18 ENCOUNTER — Ambulatory Visit: Payer: Managed Care, Other (non HMO) | Admitting: Cardiology

## 2023-10-18 ENCOUNTER — Encounter: Payer: Self-pay | Admitting: Cardiology

## 2023-10-18 VITALS — BP 124/78 | HR 79 | Temp 97.8°F | Ht 65.0 in | Wt 225.0 lb

## 2023-10-18 DIAGNOSIS — J069 Acute upper respiratory infection, unspecified: Secondary | ICD-10-CM

## 2023-10-18 DIAGNOSIS — J0100 Acute maxillary sinusitis, unspecified: Secondary | ICD-10-CM | POA: Insufficient documentation

## 2023-10-18 MED ORDER — FLUTICASONE PROPIONATE 50 MCG/ACT NA SUSP: 1.0000 | Freq: Every day | NASAL | 2 refills | Status: DC

## 2023-10-18 MED ORDER — METHYLPREDNISOLONE 4 MG PO TBPK: ORAL_TABLET | ORAL | 0 refills | Status: DC

## 2023-10-18 MED ORDER — MONTELUKAST SODIUM 10 MG PO TABS: 10.0000 mg | ORAL_TABLET | Freq: Every day | ORAL | 2 refills | Status: DC

## 2023-10-18 MED ORDER — DOXYCYCLINE HYCLATE 100 MG PO TABS: 100.0000 mg | ORAL_TABLET | Freq: Two times a day (BID) | ORAL | 0 refills | Status: AC

## 2023-10-18 MED ORDER — BENZONATATE 100 MG PO CAPS
100.0000 mg | ORAL_CAPSULE | Freq: Three times a day (TID) | ORAL | 1 refills | Status: DC | PRN
Start: 2023-10-18 — End: 2024-05-12

## 2023-10-18 MED ORDER — ALBUTEROL SULFATE HFA 108 (90 BASE) MCG/ACT IN AERS: 2.0000 | INHALATION_SPRAY | Freq: Four times a day (QID) | RESPIRATORY_TRACT | 2 refills | Status: AC | PRN

## 2023-10-18 NOTE — Patient Instructions (Addendum)
Doxycycline antibiotic Medrol dose pack prednisone Flonase nasal spray Albuterol inhaler Drink plenty of fluids Mucinex Tessalon pearls for cough Singulair antihistamine

## 2023-10-18 NOTE — Progress Notes (Signed)
Established Patient Office Visit  Subjective:  Patient ID: Kristin Vargas, female    DOB: [birth year]  Age: 48 y.o. MRN: [id number]  Chief Complaint  Patient presents with  . Acute Visit    Head and Chest Congestion, Cough , No Voice No Fever    Patient in office for an acute visit, complaining of head and chest congestion, cough. Patient states symptoms started 3 days ago. Complaining of nasal and chest congestion, PND, rhinorrhea, shortness of breath, non-productive cough and hoarseness. Patient has a history of asthma. Will send in Doxycycline, Medrol dose pack, Flonase, Albuterol inhaler, Tessalon pearls, Singulair. Recommend drinking plenty of fluids, Mucinex, rest.   Cough This is a new problem. The current episode started in the past 7 days. The problem has been unchanged. Associated symptoms include nasal congestion, postnasal drip, rhinorrhea and shortness of breath. Pertinent negatives include no chest pain, ear congestion, ear pain, fever, headaches, myalgias, sore throat or wheezing. Nothing aggravates the symptoms. Treatments tried: Theraflu cold and sinus. The treatment provided no relief. Her past medical history is significant for asthma.    No other concerns at this time.   Past Medical History:  Diagnosis Date  . 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 Asthma   . Complication of internal fixation device (HCC) 01/01/2020  . Impingement of anterior part of ankle 04/11/2019  . Peroneal tendinitis 04/11/2019  . Strain of peroneal tendon 04/11/2019  . Stress fracture of metatarsal bone 08/03/2019  . Synovitis of ankle 01/01/2020    Past Surgical History:  Procedure Laterality Date  . APPENDECTOMY      Social History   Socioeconomic History  . Marital status: Married    Spouse name: albano  . Number of children: 1  . Years of education: Not on file  . Highest education level: GED or equivalent  Occupational History  . Not on file  Tobacco Use  . Smoking status:  Former    Current packs/day: 0.00    Types: Cigarettes    Start date: 7    Quit date: 07/25/2000    Years since quitting: 23.2  . Smokeless tobacco: Never  Vaping Use  . Vaping status: Never Used  Substance and Sexual Activity  . Alcohol use: Never  . Drug use: Never  . Sexual activity: Yes    Birth control/protection: None  Other Topics Concern  . Not on file  Social History Narrative  . Not on file   Social Drivers of Health   Financial Resource Strain: Low Risk  (07/25/2018)   Overall Financial Resource Strain (CARDIA)   . Difficulty of Paying Living Expenses: Not hard at all  Food Insecurity: No Food Insecurity (07/25/2018)   Hunger Vital Sign   . Worried About Programme researcher, broadcasting/film/video in the Last Year: Never true   . Ran Out of Food in the Last Year: Never true  Transportation Needs: No Transportation Needs (07/25/2018)   PRAPARE - Transportation   . Lack of Transportation (Medical): No   . Lack of Transportation (Non-Medical): No  Physical Activity: Inactive (07/25/2018)   Exercise Vital Sign   . Days of Exercise per Week: 0 days   . Minutes of Exercise per Session: 0 min  Stress: Stress Concern Present (07/25/2018)   [employer] of Occupational Health - Occupational Stress Questionnaire   . Feeling of Stress : To some extent  Social Connections: Unknown (07/25/2018)   Social Connection and Isolation Panel [NHANES]   . Frequency of Communication with Friends and  Family: Not on file   . Frequency of Social Gatherings with Friends and Family: Not on file   . Attends Religious Services: Never   . Active Member of Clubs or Organizations: No   . Attends Banker Meetings: Never   . Marital Status: Married  Catering manager Violence: Not At Risk (07/25/2018)   Humiliation, Afraid, Rape, and Kick questionnaire   . Fear of Current or Ex-Partner: No   . Emotionally Abused: No   . Physically Abused: No   . Sexually Abused: No    Family History   Problem Relation Age of Onset  . Alcohol abuse Mother   . Epilepsy Paternal Grandmother   . Epilepsy Brother     Allergies  Allergen Reactions  . Morphine And Codeine Hives and Other (See Comments)  . Sulfa Antibiotics Hives and Itching  . Vicodin [Hydrocodone-Acetaminophen] Hives  . Penicillins Other (See Comments) and Rash    Does not work for patient    Outpatient Medications Prior to Visit  Medication Sig  . cholecalciferol (VITAMIN D) 25 MCG (1000 UT) tablet Take 1,000 Units by mouth daily.  Marland Kitchen ibuprofen (ADVIL) 800 MG tablet Take 1 tablet (800 mg total) by mouth every 8 (eight) hours as needed.  . Multiple Vitamin (MULTIVITAMIN WITH MINERALS) TABS tablet Take 1 tablet by mouth daily.   No facility-administered medications prior to visit.    Review of Systems  Constitutional: Negative.  Negative for fever.  HENT:  Positive for congestion, postnasal drip, rhinorrhea and sinus pain. Negative for ear pain and sore throat.   Eyes: Negative.   Respiratory:  Positive for cough and shortness of breath. Negative for sputum production and wheezing.   Cardiovascular: Negative.  Negative for chest pain.  Gastrointestinal: Negative.  Negative for abdominal pain, constipation and diarrhea.  Genitourinary: Negative.   Musculoskeletal:  Negative for joint pain and myalgias.  Skin: Negative.   Neurological: Negative.  Negative for dizziness and headaches.  Endo/Heme/Allergies: Negative.   All other systems reviewed and are negative.      Objective:   BP 124/78   Pulse 79   Temp 97.8 F (36.6 C) (Tympanic)   Ht 5\' 5"  (1.651 m)   Wt 225 lb (102.1 kg)   SpO2 98%   BMI 37.44 kg/m   Vitals:   10/18/23 1027  BP: 124/78  Pulse: 79  Temp: 97.8 F (36.6 C)  Height: 5\' 5"  (1.651 m)  Weight: 225 lb (102.1 kg)  SpO2: 98%  TempSrc: Tympanic  BMI (Calculated): 37.44    Physical Exam Vitals and nursing note reviewed.  Constitutional:      Appearance: Normal appearance. She  is normal weight.  HENT:     Head: Normocephalic and atraumatic.     Nose: Nose normal.     Mouth/Throat:     Mouth: Mucous membranes are moist.  Eyes:     Extraocular Movements: Extraocular movements intact.     Conjunctiva/sclera: Conjunctivae normal.     Pupils: Pupils are equal, round, and reactive to light.  Cardiovascular:     Rate and Rhythm: Normal rate and regular rhythm.     Pulses: Normal pulses.     Heart sounds: Normal heart sounds.  Pulmonary:     Effort: Pulmonary effort is normal. No respiratory distress.     Breath sounds: Normal breath sounds. No stridor. No wheezing, rhonchi or rales.  Abdominal:     General: Abdomen is flat. Bowel sounds are normal.  Palpations: Abdomen is soft.  Musculoskeletal:        General: Normal range of motion.     Cervical back: Normal range of motion.  Skin:    General: Skin is warm and dry.  Neurological:     General: No focal deficit present.     Mental Status: She is alert and oriented to person, place, and time.  Psychiatric:        Mood and Affect: Mood normal.        Behavior: Behavior normal.        Thought Content: Thought content normal.        Judgment: Judgment normal.     No results found for any visits on 10/18/23.  Recent Results (from the past 2160 hours)  CBC with Diff     Status: Abnormal   Collection Time: 08/11/23 11:46 AM  Result Value Ref Range   WBC 7.2 3.4 - 10.8 x10E3/uL    Comment: **Effective August 16, 2023 profile 811914 WBC will be made**   non-orderable as a stand-alone order code.    RBC 4.97 3.77 - 5.28 x10E6/uL   Hemoglobin 13.2 11.1 - 15.9 g/dL   Hematocrit 78.2 95.6 - 46.6 %   MCV 85 79 - 97 fL   MCH 26.6 26.6 - 33.0 pg   MCHC 31.4 (L) 31.5 - 35.7 g/dL   RDW 21.3 08.6 - 57.8 %   Platelets 345 150 - 450 x10E3/uL   Neutrophils 59 Not Estab. %   Lymphs 29 Not Estab. %   Monocytes 8 Not Estab. %   Eos 3 Not Estab. %   Basos 1 Not Estab. %   Neutrophils Absolute 4.3 1.4 - 7.0  x10E3/uL   Lymphocytes Absolute 2.1 0.7 - 3.1 x10E3/uL   Monocytes Absolute 0.6 0.1 - 0.9 x10E3/uL   EOS (ABSOLUTE) 0.2 0.0 - 0.4 x10E3/uL   Basophils Absolute 0.1 0.0 - 0.2 x10E3/uL   Immature Granulocytes 0 Not Estab. %   Immature Grans (Abs) 0.0 0.0 - 0.1 x10E3/uL  Lipid panel     Status: None   Collection Time: 08/11/23 11:46 AM  Result Value Ref Range   Cholesterol, Total 158 100 - 199 mg/dL   Triglycerides 91 0 - 149 mg/dL   HDL 51 >46 mg/dL   VLDL Cholesterol Cal 17 5 - 40 mg/dL   LDL Chol Calc (NIH) 90 0 - 99 mg/dL   Chol/HDL Ratio 3.1 0.0 - 4.4 ratio    Comment:                                   T. Chol/HDL Ratio                                             Men  Women                               1/2 Avg.Risk  3.4    3.3                                   Avg.Risk  5.0    4.4  2X Avg.Risk  9.6    7.1                                3X Avg.Risk 23.4   11.0   VITAMIN D 25 Hydroxy (Vit-D Deficiency, Fractures)     Status: None   Collection Time: 08/11/23 11:46 AM  Result Value Ref Range   Vit D, 25-Hydroxy 43.9 30.0 - 100.0 ng/mL    Comment: Vitamin D deficiency has been defined by the Institute of Medicine and an Endocrine Society practice guideline as a level of serum 25-OH vitamin D less than 20 ng/mL (1,2). The Endocrine Society went on to further define vitamin D insufficiency as a level between 21 and 29 ng/mL (2). 1. IOM (Institute of Medicine). 2010. Dietary reference    intakes for calcium and D. [place]: The    [employer]. 2. Holick MF, Binkley [place], Bischoff-Ferrari HA, et al.    Evaluation, treatment, and prevention of vitamin D    deficiency: an Endocrine Society clinical practice    guideline. JCEM. 2011 Jul; 96(7):1911-30.   CMP14+EGFR     Status: None   Collection Time: 08/11/23 11:46 AM  Result Value Ref Range   Glucose 80 70 - 99 mg/dL   BUN 11 6 - 24 mg/dL   Creatinine, Ser 1.61 0.57 - 1.00 mg/dL    eGFR 88 >09 UE/AVW/0.98   BUN/Creatinine Ratio 13 9 - 23   Sodium 138 134 - 144 mmol/L   Potassium 4.8 3.5 - 5.2 mmol/L   Chloride 103 96 - 106 mmol/L   CO2 22 20 - 29 mmol/L   Calcium 9.4 8.7 - 10.2 mg/dL   Total Protein 6.9 6.0 - 8.5 g/dL   Albumin 4.4 3.9 - 4.9 g/dL   Globulin, Total 2.5 1.5 - 4.5 g/dL   Bilirubin Total 0.4 0.0 - 1.2 mg/dL   Alkaline Phosphatase 87 44 - 121 IU/L   AST 21 0 - 40 IU/L   ALT 18 0 - 32 IU/L  TSH     Status: None   Collection Time: 08/11/23 11:46 AM  Result Value Ref Range   TSH 1.290 0.450 - 4.500 uIU/mL  Hemoglobin A1c     Status: None   Collection Time: 08/11/23 11:46 AM  Result Value Ref Range   Hgb A1c MFr Bld 5.4 4.8 - 5.6 %    Comment:          Prediabetes: 5.7 - 6.4          Diabetes: >6.4          Glycemic control for adults with diabetes: <7.0    Est. average glucose Bld gHb Est-mCnc 108 mg/dL  Vitamin J19     Status: None   Collection Time: 08/11/23 11:46 AM  Result Value Ref Range   Vitamin B-12 567 232 - 1,245 pg/mL  Hepatitis C Ab reflex to Quant PCR     Status: None   Collection Time: 08/11/23 11:46 AM  Result Value Ref Range   HCV Ab Non Reactive Non Reactive  Interpretation:     Status: None   Collection Time: 08/11/23 11:46 AM  Result Value Ref Range   HCV Interp 1: Comment     Comment: Not infected with HCV unless early or acute infection is suspected (which may be delayed in an immunocompromised individual), or other evidence exists to indicate HCV infection.  Assessment & Plan:  Doxycycline antibiotic Medrol dose pack prednisone Flonase nasal spray Albuterol inhaler Drink plenty of fluids Mucinex Tessalon pearls for cough Singulair antihistamine  Problem List Items Addressed This Visit       Respiratory   Upper respiratory infection, acute - Primary   Acute non-recurrent maxillary sinusitis   Relevant Medications   methylPREDNISolone (MEDROL DOSEPAK) 4 MG TBPK tablet   doxycycline (VIBRA-TABS)  100 MG tablet   fluticasone (FLONASE) 50 MCG/ACT nasal spray   benzonatate (TESSALON PERLES) 100 MG capsule    Return if symptoms worsen or fail to improve.   Total time spent: 25 minutes  [employer], NP  10/18/2023   This document may have been prepared by Dragon Voice Recognition software and as such may include unintentional dictation errors.

## 2023-10-22 ENCOUNTER — Ambulatory Visit
Admission: RE | Admit: 2023-10-22 | Discharge: 2023-10-22 | Disposition: A | Discharge status: Home / Self Care | Payer: Self-pay | Attending: Cardiology | Admitting: Cardiology

## 2023-10-22 ENCOUNTER — Encounter: Payer: Self-pay | Admitting: Cardiology

## 2023-10-22 ENCOUNTER — Ambulatory Visit
Admission: RE | Admit: 2023-10-22 | Discharge: 2023-10-22 | Disposition: A | Discharge status: Home / Self Care | Payer: Self-pay | Source: Ambulatory Visit | Attending: Cardiology

## 2023-10-22 ENCOUNTER — Ambulatory Visit: Payer: Managed Care, Other (non HMO) | Admitting: Cardiology

## 2023-10-22 VITALS — BP 122/80 | HR 81 | Ht 65.0 in | Wt 222.0 lb

## 2023-10-22 DIAGNOSIS — J069 Acute upper respiratory infection, unspecified: Secondary | ICD-10-CM

## 2023-10-22 DIAGNOSIS — J4520 Mild intermittent asthma, uncomplicated: Secondary | ICD-10-CM | POA: Diagnosis not present

## 2023-10-22 DIAGNOSIS — R051 Acute cough: Secondary | ICD-10-CM | POA: Insufficient documentation

## 2023-10-22 DIAGNOSIS — Z0130 Encounter for examination of blood pressure without abnormal findings: Secondary | ICD-10-CM

## 2023-10-22 NOTE — Progress Notes (Signed)
 Established Patient Office Visit  Subjective:  Patient ID: Kristin Vargas, female    DOB: 1976-03-25  Age: 48 y.o. MRN: [id number]  Chief Complaint  Patient presents with   Acute Visit    Still not feeling good, feels it in her chest    Patient in office for an acute visit, patient seen on 2/3 2025 for cough, congestion, sinus pain and shortness of breath. Sent in Doxycycline , Medrol  dose pack, Flonase , Albuterol  inhaler, Tessalon  pearls, Singulair . Recommend drinking plenty of fluids, Mucinex, rest. Patient returns today, still coughing, chest congestion. Will order a chest xray.     No other concerns at this time.   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   Impingement of anterior part of ankle 04/11/2019   Peroneal tendinitis 04/11/2019   Strain of peroneal tendon 04/11/2019   Stress fracture of metatarsal bone 08/03/2019   Synovitis of ankle 01/01/2020    Past Surgical History:  Procedure Laterality Date   APPENDECTOMY      Social History   Socioeconomic History   Marital status: Married    Spouse name: albano   Number of children: 1   Years of education: Not on file   Highest education level: GED or equivalent  Occupational History   Not on file  Tobacco Use   Smoking status: Former    Current packs/day: 0.00    Types: Cigarettes    Start date: 54    Quit date: 07/25/2000    Years since quitting: 23.2   Smokeless tobacco: Never  Vaping Use   Vaping status: Never Used  Substance and Sexual Activity   Alcohol use: Never   Drug use: Never   Sexual activity: Yes    Birth control/protection: None  Other Topics Concern   Not on file  Social History Narrative   Not on file   Social Drivers of Health   Financial Resource Strain: Low Risk  (07/25/2018)   Overall Financial Resource Strain (CARDIA)    Difficulty of Paying Living Expenses: Not hard at all   Food Insecurity: No Food Insecurity (07/25/2018)   Hunger Vital Sign    Worried About Running Out of Food in the Last Year: Never true    Ran Out of Food in the Last Year: Never true  Transportation Needs: No Transportation Needs (07/25/2018)   PRAPARE - Administrator, Civil Service (Medical): No    Lack of Transportation (Non-Medical): No  Physical Activity: Inactive (07/25/2018)   Exercise Vital Sign    Days of Exercise per Week: 0 days    Minutes of Exercise per Session: 0 min  Stress: Stress Concern Present (07/25/2018)   [employer] of Occupational Health - Occupational Stress Questionnaire    Feeling of Stress : To some extent  Social Connections: Unknown (07/25/2018)   Social Connection and Isolation Panel [NHANES]    Frequency of Communication with Friends and Family: Not on file    Frequency of Social Gatherings with Friends and Family: Not on file    Attends Religious Services: Never    Active Member of Clubs or Organizations: No    Attends Banker Meetings: Never    Marital Status: Married  Catering Manager Violence: Not At Risk (07/25/2018)   Humiliation, Afraid, Rape, and Kick questionnaire    Fear of Current or Ex-Partner: No    Emotionally Abused: No  Physically Abused: No    Sexually Abused: No    Family History  Problem Relation Age of Onset   Alcohol abuse Mother    Epilepsy Paternal Grandmother    Epilepsy Brother     Allergies  Allergen Reactions   Morphine And Codeine Hives and Other (See Comments)   Sulfa Antibiotics Hives and Itching   Vicodin [Hydrocodone-Acetaminophen ] Hives   Penicillins Other (See Comments) and Rash    Does not work for patient    Outpatient Medications Prior to Visit  Medication Sig   albuterol  (VENTOLIN  HFA) 108 (90 Base) MCG/ACT inhaler Inhale 2 puffs into the lungs every 6 (six) hours as needed for wheezing or shortness of breath.   benzonatate  (TESSALON  PERLES) 100 MG capsule Take 1  capsule (100 mg total) by mouth 3 (three) times daily as needed for cough.   cholecalciferol (VITAMIN D ) 25 MCG (1000 UT) tablet Take 1,000 Units by mouth daily.   doxycycline  (VIBRA -TABS) 100 MG tablet Take 1 tablet (100 mg total) by mouth 2 (two) times daily for 5 days.   fluticasone  (FLONASE ) 50 MCG/ACT nasal spray Place 1 spray into both nostrils daily.   ibuprofen  (ADVIL ) 800 MG tablet Take 1 tablet (800 mg total) by mouth every 8 (eight) hours as needed.   methylPREDNISolone  (MEDROL  DOSEPAK) 4 MG TBPK tablet Take as directied   montelukast  (SINGULAIR ) 10 MG tablet Take 1 tablet (10 mg total) by mouth at bedtime.   Multiple Vitamin (MULTIVITAMIN WITH MINERALS) TABS tablet Take 1 tablet by mouth daily.   No facility-administered medications prior to visit.    Review of Systems  Constitutional: Negative.   HENT: Negative.    Eyes: Negative.   Respiratory: Negative.  Negative for shortness of breath.   Cardiovascular: Negative.  Negative for chest pain.  Gastrointestinal: Negative.  Negative for abdominal pain, constipation and diarrhea.  Genitourinary: Negative.   Musculoskeletal:  Negative for joint pain and myalgias.  Skin: Negative.   Neurological: Negative.  Negative for dizziness and headaches.  Endo/Heme/Allergies: Negative.   All other systems reviewed and are negative.      Objective:   BP 122/80   Pulse 81   Ht 5' 5 (1.651 m)   Wt 222 lb (100.7 kg)   SpO2 98%   BMI 36.94 kg/m   Vitals:   10/22/23 1107  BP: 122/80  Pulse: 81  Height: 5' 5 (1.651 m)  Weight: 222 lb (100.7 kg)  SpO2: 98%  BMI (Calculated): 36.94    Physical Exam Vitals and nursing note reviewed.  Constitutional:      Appearance: Normal appearance. She is normal weight.  HENT:     Head: Normocephalic and atraumatic.     Nose: Nose normal.     Mouth/Throat:     Mouth: Mucous membranes are moist.  Eyes:     Extraocular Movements: Extraocular movements intact.     Conjunctiva/sclera:  Conjunctivae normal.     Pupils: Pupils are equal, round, and reactive to light.  Cardiovascular:     Rate and Rhythm: Normal rate and regular rhythm.     Pulses: Normal pulses.     Heart sounds: Normal heart sounds.  Pulmonary:     Effort: Pulmonary effort is normal.     Breath sounds: Normal breath sounds.  Abdominal:     General: Abdomen is flat. Bowel sounds are normal.     Palpations: Abdomen is soft.  Musculoskeletal:        General: Normal range of motion.  Cervical back: Normal range of motion.  Skin:    General: Skin is warm and dry.  Neurological:     General: No focal deficit present.     Mental Status: She is alert and oriented to person, place, and time.  Psychiatric:        Mood and Affect: Mood normal.        Behavior: Behavior normal.        Thought Content: Thought content normal.        Judgment: Judgment normal.      No results found for any visits on 10/22/23.  Recent Results (from the past 2160 hours)  CBC with Diff     Status: Abnormal   Collection Time: 08/11/23 11:46 AM  Result Value Ref Range   WBC 7.2 3.4 - 10.8 x10E3/uL    Comment: **Effective August 16, 2023 profile [id number] WBC will be made**   non-orderable as a stand-alone order code.    RBC 4.97 3.77 - 5.28 x10E6/uL   Hemoglobin 13.2 11.1 - 15.9 g/dL   Hematocrit 57.9 65.9 - 46.6 %   MCV 85 79 - 97 fL   MCH 26.6 26.6 - 33.0 pg   MCHC 31.4 (L) 31.5 - 35.7 g/dL   RDW 86.0 88.2 - 84.5 %   Platelets 345 150 - 450 x10E3/uL   Neutrophils 59 Not Estab. %   Lymphs 29 Not Estab. %   Monocytes 8 Not Estab. %   Eos 3 Not Estab. %   Basos 1 Not Estab. %   Neutrophils Absolute 4.3 1.4 - 7.0 x10E3/uL   Lymphocytes Absolute 2.1 0.7 - 3.1 x10E3/uL   Monocytes Absolute 0.6 0.1 - 0.9 x10E3/uL   EOS (ABSOLUTE) 0.2 0.0 - 0.4 x10E3/uL   Basophils Absolute 0.1 0.0 - 0.2 x10E3/uL   Immature Granulocytes 0 Not Estab. %   Immature Grans (Abs) 0.0 0.0 - 0.1 x10E3/uL  Lipid panel     Status: None    Collection Time: 08/11/23 11:46 AM  Result Value Ref Range   Cholesterol, Total 158 100 - 199 mg/dL   Triglycerides 91 0 - 149 mg/dL   HDL 51 >60 mg/dL   VLDL Cholesterol Cal 17 5 - 40 mg/dL   LDL Chol Calc (NIH) 90 0 - 99 mg/dL   Chol/HDL Ratio 3.1 0.0 - 4.4 ratio    Comment:                                   T. Chol/HDL Ratio                                             Men  Women                               1/2 Avg.Risk  3.4    3.3                                   Avg.Risk  5.0    4.4  2X Avg.Risk  9.6    7.1                                3X Avg.Risk 23.4   11.0   VITAMIN D  25 Hydroxy (Vit-D Deficiency, Fractures)     Status: None   Collection Time: 08/11/23 11:46 AM  Result Value Ref Range   Vit D, 25-Hydroxy 43.9 30.0 - 100.0 ng/mL    Comment: Vitamin D  deficiency has been defined by the Institute of Medicine and an Endocrine Society practice guideline as a level of serum 25-OH vitamin D  less than 20 ng/mL (1,2). The Endocrine Society went on to further define vitamin D  insufficiency as a level between 21 and 29 ng/mL (2). 1. IOM (Institute of Medicine). 2010. Dietary reference    intakes for calcium and D. [place]: The    [employer]. 2. Holick MF, Binkley [place], Bischoff-Ferrari HA, et al.    Evaluation, treatment, and prevention of vitamin D     deficiency: an Endocrine Society clinical practice    guideline. JCEM. 2011 Jul; 96(7):1911-30.   CMP14+EGFR     Status: None   Collection Time: 08/11/23 11:46 AM  Result Value Ref Range   Glucose 80 70 - 99 mg/dL   BUN 11 6 - 24 mg/dL   Creatinine, Ser 9.17 0.57 - 1.00 mg/dL   eGFR 88 >40 fO/fpw/8.26   BUN/Creatinine Ratio 13 9 - 23   Sodium 138 134 - 144 mmol/L   Potassium 4.8 3.5 - 5.2 mmol/L   Chloride 103 96 - 106 mmol/L   CO2 22 20 - 29 mmol/L   Calcium 9.4 8.7 - 10.2 mg/dL   Total Protein 6.9 6.0 - 8.5 g/dL   Albumin 4.4 3.9 - 4.9 g/dL   Globulin, Total 2.5 1.5 - 4.5  g/dL   Bilirubin Total 0.4 0.0 - 1.2 mg/dL   Alkaline Phosphatase 87 44 - 121 IU/L   AST 21 0 - 40 IU/L   ALT 18 0 - 32 IU/L  TSH     Status: None   Collection Time: 08/11/23 11:46 AM  Result Value Ref Range   TSH 1.290 0.450 - 4.500 uIU/mL  Hemoglobin A1c     Status: None   Collection Time: 08/11/23 11:46 AM  Result Value Ref Range   Hgb A1c MFr Bld 5.4 4.8 - 5.6 %    Comment:          Prediabetes: 5.7 - 6.4          Diabetes: >6.4          Glycemic control for adults with diabetes: <7.0    Est. average glucose Bld gHb Est-mCnc 108 mg/dL  Vitamin B12     Status: None   Collection Time: 08/11/23 11:46 AM  Result Value Ref Range   Vitamin B-12 567 232 - 1,245 pg/mL  Hepatitis C Ab reflex to Quant PCR     Status: None   Collection Time: 08/11/23 11:46 AM  Result Value Ref Range   HCV Ab Non Reactive Non Reactive  Interpretation:     Status: None   Collection Time: 08/11/23 11:46 AM  Result Value Ref Range   HCV Interp 1: Comment     Comment: Not infected with HCV unless early or acute infection is suspected (which may be delayed in an immunocompromised individual), or other evidence exists to indicate HCV infection.  Assessment & Plan:  Stat chest xray today.  Finish previously prescribed medications.  Continue Mucinex, rest, plenty of water.   Problem List Items Addressed This Visit       Respiratory   Mild intermittent asthma - Primary   Relevant Orders   DG Chest 2 View   Upper respiratory infection, acute   Relevant Orders   DG Chest 2 View   Other Visit Diagnoses       Acute cough       Relevant Orders   DG Chest 2 View       Return if symptoms worsen or fail to improve.   Total time spent: 25 minutes  [employer], NP  10/22/2023   This document may have been prepared by Dragon Voice Recognition software and as such may include unintentional dictation errors.

## 2023-10-25 NOTE — Progress Notes (Signed)
 Informed via Mychart message

## 2023-11-10 ENCOUNTER — Ambulatory Visit: Payer: Managed Care, Other (non HMO) | Admitting: Family

## 2023-11-10 MED ORDER — LOSARTAN POTASSIUM 25 MG PO TABS: 25.0000 mg | ORAL_TABLET | Freq: Every day | ORAL | 0 refills | Status: DC

## 2023-11-11 ENCOUNTER — Other Ambulatory Visit: Payer: Self-pay

## 2023-11-11 ENCOUNTER — Encounter: Payer: Self-pay | Admitting: Emergency Medicine

## 2023-11-11 ENCOUNTER — Emergency Department
Admission: EM | Admit: 2023-11-11 | Discharge: 2023-11-11 | Disposition: A | Discharge status: Home / Self Care | Payer: Managed Care, Other (non HMO) | Attending: Emergency Medicine | Admitting: Emergency Medicine

## 2023-11-11 ENCOUNTER — Emergency Department: Payer: Managed Care, Other (non HMO)

## 2023-11-11 ENCOUNTER — Telehealth: Payer: Self-pay

## 2023-11-11 DIAGNOSIS — R519 Headache, unspecified: Secondary | ICD-10-CM | POA: Diagnosis present

## 2023-11-11 DIAGNOSIS — E871 Hypo-osmolality and hyponatremia: Secondary | ICD-10-CM | POA: Insufficient documentation

## 2023-11-11 LAB — BASIC METABOLIC PANEL
Anion gap: 8 (ref 5–15)
BUN: 11 mg/dL (ref 6–20)
CO2: 24 mmol/L (ref 22–32)
Calcium: 8.9 mg/dL (ref 8.9–10.3)
Chloride: 102 mmol/L (ref 98–111)
Creatinine, Ser: 0.7 mg/dL (ref 0.44–1.00)
GFR, Estimated: >60 mL/min (ref >60)
Glucose, Bld: 89 mg/dL (ref 70–99)
Potassium: 3.9 mmol/L (ref 3.5–5.1)
Sodium: 134 mmol/L — ABNORMAL LOW (ref 135–145)

## 2023-11-11 LAB — CBC
HCT: 40.3 % (ref 36.0–46.0)
Hemoglobin: 13.3 g/dL (ref 12.0–15.0)
MCH: 27.1 pg (ref 26.0–34.0)
MCHC: 33 g/dL (ref 30.0–36.0)
MCV: 82.2 fL (ref 80.0–100.0)
Platelets: 296 10*3/uL (ref 150–400)
RBC: 4.9 MIL/uL (ref 3.87–5.11)
RDW: 13.7 % (ref 11.5–15.5)
WBC: 7.4 10*3/uL (ref 4.0–10.5)
nRBC: 0 % (ref 0.0–0.2)

## 2023-11-11 LAB — SEDIMENTATION RATE: Sed Rate: 8 mm/h (ref 0–20)

## 2023-11-11 LAB — POC URINE PREG, ED: Preg Test, Ur: NEGATIVE

## 2023-11-11 MED ORDER — METOCLOPRAMIDE HCL 5 MG/ML IJ SOLN
10.0000 mg | Freq: Once | INTRAMUSCULAR | Status: AC
  Administered 2023-11-11: 10 mg via INTRAVENOUS
  Filled 2023-11-11: qty 2

## 2023-11-11 MED ORDER — IOHEXOL 350 MG/ML SOLN
75.0000 mL | Freq: Once | INTRAVENOUS | Status: AC | PRN
  Administered 2023-11-11: 75 mL via INTRAVENOUS

## 2023-11-11 MED ORDER — DIPHENHYDRAMINE HCL 50 MG/ML IJ SOLN
12.5000 mg | Freq: Once | INTRAMUSCULAR | Status: AC
  Administered 2023-11-11: 12.5 mg via INTRAVENOUS
  Filled 2023-11-11: qty 1

## 2023-11-11 MED ORDER — ACETAMINOPHEN 500 MG PO TABS
1000.0000 mg | ORAL_TABLET | Freq: Once | ORAL | Status: AC
  Administered 2023-11-11: 1000 mg via ORAL
  Filled 2023-11-11: qty 2

## 2023-11-11 NOTE — Telephone Encounter (Signed)
 Patient Kristin Vargas stating that she  didn't pick up her BP med last night but got it this AM and when she checked her BP it was 117/81 she wants to know should she start taking that new BP med or hold off?

## 2023-11-11 NOTE — ED Provider Notes (Signed)
 [facility] Provider Note    Event Date/Time   First MD Initiated Contact with Patient 11/11/23 1421     (approximate)   History   Hypertension   HPI  Kristin Vargas is a 49 y.o. female who is otherwise healthy comes in with concern for headaches.  Patient denies ever having a history of headaches.  She reports that just recently she started having intermittent headaches that progressively worsen but states that the headache was worse a couple days ago and then she took a migraine medicine that her primary care doctor prescribed her and it went away then this morning she woke up again with a severe headache and does report that it has been getting worse.  She denies any vision changes at this time.  She reports pain radiating down into her neck.  This is mostly on the left side.  She also reports a little bit of pain on the left temporal area.  She reports being told that her blood pressure was elevated and they started her on losartan but she states that when she is not in pain her blood pressure is normal.  Physical Exam   Triage Vital Signs: ED Triage Vitals  Encounter Vitals Group     BP 11/11/23 1330 136/86     Systolic BP Percentile --      Diastolic BP Percentile --      Pulse Rate 11/11/23 1330 74     Resp 11/11/23 1330 18     Temp 11/11/23 1330 98.6 F (37 C)     Temp Source 11/11/23 1330 Oral     SpO2 11/11/23 1330 100 %     Weight 11/11/23 1328 225 lb (102.1 kg)     Height 11/11/23 1328 5\' 5"  (1.651 m)     Head Circumference --      Peak Flow --      Pain Score 11/11/23 1328 9     Pain Loc --      Pain Education --      Exclude from Growth Chart --     Most recent vital signs: Vitals:   11/11/23 1330  BP: 136/86  Pulse: 74  Resp: 18  Temp: 98.6 F (37 C)  SpO2: 100%     General: Awake, no distress.  CV:  Good peripheral perfusion.  Resp:  Normal effort.  Abd:  No distention.  Other:  Cranial nerves II through XII are intact.   She does have some tenderness at her left temporal area.  She reports some pain radiating through her left neck.   ED Results / Procedures / Treatments   Labs (all labs ordered are listed, but only abnormal results are displayed) Labs Reviewed  BASIC METABOLIC PANEL - Abnormal; Notable for the following components:      Result Value   Sodium 134 (*)    All other components within normal limits  CBC  SEDIMENTATION RATE      RADIOLOGY CT pending  PROCEDURES:  Critical Care performed: No  Procedures   MEDICATIONS ORDERED IN ED: Medications  metoCLOPramide (REGLAN) injection 10 mg (has no administration in time range)  diphenhydrAMINE (BENADRYL) injection 12.5 mg (has no administration in time range)  acetaminophen (TYLENOL) tablet 1,000 mg (has no administration in time range)     IMPRESSION / MDM / ASSESSMENT AND PLAN / ED COURSE  I reviewed the triage vital signs and the nursing notes.   Patient's presentation is most consistent with acute presentation  with potential threat to life or bodily function.   Patient comes in with concern for elevated blood pressure in the setting of new onset headaches.  Denies any prior history will get CT imaging of the head evaluate for intracranial mass.  Given she reports intermittent nature over the past few days and it was most severe a few days ago we will get CT angio to make sure no aneurysm.  Will add on sed rate to evaluate for temporal arteritis.  Eye muscles are intact no OCPs to suggest venous thrombus.  Patient will be given migraine cocktail handed off to oncoming team pending CT, ESR.  Discussed with patient that I suspect that her blood pressure is elevated to the due to the pain she reports having normal blood pressure when she is not in pain.  We can hold off on antihypertensives at this time. CBC reassuring BMP shows slightly low sodium  FINAL CLINICAL IMPRESSION(S) / ED DIAGNOSES   Final diagnoses:  Intractable  headache, unspecified chronicity pattern, unspecified headache type     Rx / DC Orders   ED Discharge Orders     None        Note:  This document was prepared using Dragon voice recognition software and may include unintentional dictation errors.   Concha Se, MD 11/11/23 [phone]

## 2023-11-11 NOTE — ED Provider Notes (Addendum)
-----------------------------------------   6:06 PM on 11/11/2023 -----------------------------------------  CT angio is negative for acute findings.  On reassessment, the patient's headache was improved.  We gave a second dose of medication and it is now better.  The patient feels comfortable going home.  I counseled her on the results of the workup.  She is stable for discharge at this time.  Return precautions given, and she expresses understanding.  Patient was informed of the thyroid nodule seen on CT and the need for follow-up with ultrasound ordered by her PMD.  IMPRESSION:  1. No acute intracranial abnormality.  2. No intracranial large vessel occlusion or significant stenosis.  3. No hemodynamically significant stenosis in the neck.  4. 1.6 cm left thyroid nodule. Recommend further evaluation with  dedicated thyroid ultrasound.      Dionne Bucy, MD 11/11/23 [phone]

## 2023-11-11 NOTE — ED Triage Notes (Signed)
 Pt via POV from home. Pt c/o HTN, denies hx. Pt was stating that she was prescribed Losartan last night that she has not been able to get filled. States that she took a migraine medication last night and her headache got better and her blood pressure got better. Pt is A&Ox4 and NAD Reports 148/93 at home.

## 2023-11-12 NOTE — Telephone Encounter (Signed)
 Patient sent message via appt portal on my chart upset bc no one called her back, she left the message around 9:17am yesterday morning and then sent portal message after lunch that no one responded to her and she was going to the ED bc her BP was elevating again

## 2023-11-17 ENCOUNTER — Encounter: Payer: Self-pay | Admitting: Family

## 2023-11-17 ENCOUNTER — Ambulatory Visit: Payer: Managed Care, Other (non HMO) | Admitting: Family

## 2023-11-17 VITALS — BP 131/90 | HR 67 | Ht 65.0 in | Wt 220.0 lb

## 2023-11-17 DIAGNOSIS — R946 Abnormal results of thyroid function studies: Secondary | ICD-10-CM | POA: Diagnosis not present

## 2023-11-17 DIAGNOSIS — E041 Nontoxic single thyroid nodule: Secondary | ICD-10-CM | POA: Diagnosis not present

## 2023-11-17 DIAGNOSIS — G4452 New daily persistent headache (NDPH): Secondary | ICD-10-CM | POA: Diagnosis not present

## 2023-11-17 MED ORDER — NURTEC 75 MG PO TBDP: 75.0000 mg | ORAL_TABLET | ORAL | 1 refills | Status: DC

## 2023-11-17 NOTE — Progress Notes (Signed)
 Established Patient Office Visit  Subjective:  Patient ID: Kristin Vargas, female    DOB: 1976-03-18  Age: 48 y.o. MRN: [id number]  Chief Complaint  Patient presents with   Follow-up    Hospital follow up    Last Tuesday, started out the day with a headache.  Tried Motrin , nothing.  Water Motrin  again.   By the end of the day, she could barely see.  Wednesday, blood pressure was elevated, still had headaches.  Went home, took the Nurtec, headache was better in the morning.   Blood pressure was elevated again, unsure if the blood pressure is causing the headaches or the headaches are causing the blood pressure.  After the 2nd migraine cocktail in the hospital, she was good, blood pressure had come down some.   Monday, had to leave work early.    No other concerns at this time.   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   Impingement of anterior part of ankle 04/11/2019   Peroneal tendinitis 04/11/2019   Strain of peroneal tendon 04/11/2019   Stress fracture of metatarsal bone 08/03/2019   Synovitis of ankle 01/01/2020    Past Surgical History:  Procedure Laterality Date   APPENDECTOMY      Social History   Socioeconomic History   Marital status: Married    Spouse name: albano   Number of children: 1   Years of education: Not on file   Highest education level: GED or equivalent  Occupational History   Not on file  Tobacco Use   Smoking status: Former    Current packs/day: 0.00    Types: Cigarettes    Start date: 39    Quit date: 07/25/2000    Years since quitting: 23.5   Smokeless tobacco: Never  Vaping Use   Vaping status: Never Used  Substance and Sexual Activity   Alcohol use: Never   Drug use: Never   Sexual activity: Yes    Birth control/protection: None  Other Topics Concern   Not on file  Social History Narrative   Not on file   Social  Drivers of Health   Financial Resource Strain: Low Risk  (07/25/2018)   Overall Financial Resource Strain (CARDIA)    Difficulty of Paying Living Expenses: Not hard at all  Food Insecurity: No Food Insecurity (12/21/2023)   [facility]   Hunger Vital Sign    Worried About Running Out of Food in the Last Year: Never true    Ran Out of Food in the Last Year: Never true  Transportation Needs: No Transportation Needs (12/21/2023)   Received from [facility]   PRAPARE - Transportation    Lack of Transportation (Medical): No    Lack of Transportation (Non-Medical): No  Physical Activity: Inactive (07/25/2018)   Exercise Vital Sign    Days of Exercise per Week: 0 days    Minutes of Exercise per Session: 0 min  Stress: Stress Concern Present (07/25/2018)   [employer] of Occupational Health - Occupational Stress Questionnaire    Feeling of Stress : To some extent  Social Connections: Unknown (07/25/2018)   Social Connection and Isolation Panel [NHANES]    Frequency of Communication with Friends and Family: Not on file    Frequency of Social Gatherings with Friends and Family: Not on file    Attends Religious Services: Never    Active Member  of Clubs or Organizations: No    Attends Banker Meetings: Never    Marital Status: Married  Catering manager Violence: Not At Risk (07/25/2018)   Humiliation, Afraid, Rape, and Kick questionnaire    Fear of Current or Ex-Partner: No    Emotionally Abused: No    Physically Abused: No    Sexually Abused: No    Family History  Problem Relation Age of Onset   Alcohol abuse Mother    Epilepsy Paternal Grandmother    Epilepsy Brother     Allergies  Allergen Reactions   Morphine And Codeine Hives and Other (See Comments)   Hydrocodone-Acetaminophen  Hives   Penicillins Other (See Comments), Rash and Dermatitis    Does not work for patient   Sulfa Antibiotics Hives and Itching    Review of Systems   Neurological:  Positive for dizziness and headaches.  All other systems reviewed and are negative.      Objective:   BP (!) 131/90   Pulse 67   Ht 5\' 5"  (1.651 m)   Wt 220 lb (99.8 kg)   SpO2 98%   BMI 36.61 kg/m   Vitals:   11/17/23 1300  BP: (!) 131/90  Pulse: 67  Height: 5\' 5"  (1.651 m)  Weight: 220 lb (99.8 kg)  SpO2: 98%  BMI (Calculated): 36.61    Physical Exam Vitals and nursing note reviewed.  Constitutional:      Appearance: Normal appearance. She is normal weight.  HENT:     Head: Normocephalic and atraumatic.  Eyes:     Extraocular Movements: Extraocular movements intact.     Conjunctiva/sclera: Conjunctivae normal.     Pupils: Pupils are equal, round, and reactive to light.  Cardiovascular:     Rate and Rhythm: Normal rate.  Pulmonary:     Effort: Pulmonary effort is normal.  Musculoskeletal:        General: Normal range of motion.     Cervical back: Normal range of motion.  Neurological:     General: No focal deficit present.     Mental Status: She is alert and oriented to person, place, and time. Mental status is at baseline.  Psychiatric:        Mood and Affect: Mood normal.        Behavior: Behavior normal.        Thought Content: Thought content normal.        Judgment: Judgment normal.      Results for orders placed or performed in visit on 11/17/23  Iron, TIBC and Ferritin Panel  Result Value Ref Range   Total Iron Binding Capacity 414 250 - 450 ug/dL   UIBC 782 956 - 213 ug/dL   Iron 96 27 - 086 ug/dL   Iron Saturation 23 15 - 55 %   Ferritin 23 15 - 150 ng/mL  CMP14+EGFR  Result Value Ref Range   Glucose 82 70 - 99 mg/dL   BUN 10 6 - 24 mg/dL   Creatinine, Ser 5.78 0.57 - 1.00 mg/dL   eGFR 78 >46 NG/EXB/2.84   BUN/Creatinine Ratio 11 9 - 23   Sodium 139 134 - 144 mmol/L   Potassium 4.4 3.5 - 5.2 mmol/L   Chloride 101 96 - 106 mmol/L   CO2 24 20 - 29 mmol/L   Calcium 9.6 8.7 - 10.2 mg/dL   Total Protein 7.2 6.0 - 8.5  g/dL   Albumin 4.5 3.9 - 4.9 g/dL   Globulin, Total 2.7 1.5 -  4.5 g/dL   Bilirubin Total 0.5 0.0 - 1.2 mg/dL   Alkaline Phosphatase 88 44 - 121 IU/L   AST 19 0 - 40 IU/L   ALT 21 0 - 32 IU/L  Vitamin B12  Result Value Ref Range   Vitamin B-12 1,613 (H) 232 - 1,245 pg/mL  TSH+T4F+T3Free  Result Value Ref Range   TSH 2.810 0.450 - 4.500 uIU/mL   T3, Free 3.4 2.0 - 4.4 pg/mL   Free T4 1.25 0.82 - 1.77 ng/dL    Recent Results (from the past 2160 hours)  CBC     Status: None   Collection Time: 11/11/23  1:32 PM  Result Value Ref Range   WBC 7.4 4.0 - 10.5 K/uL   RBC 4.90 3.87 - 5.11 MIL/uL   Hemoglobin 13.3 12.0 - 15.0 g/dL   HCT 16.1 09.6 - 04.5 %   MCV 82.2 80.0 - 100.0 fL   MCH 27.1 26.0 - 34.0 pg   MCHC 33.0 30.0 - 36.0 g/dL   RDW 40.9 81.1 - 91.4 %   Platelets 296 150 - 400 K/uL   nRBC 0.0 0.0 - 0.2 %    Comment: Performed at [facility], [address], [place], [place] [address]  Basic metabolic panel     Status: Abnormal   Collection Time: 11/11/23  1:32 PM  Result Value Ref Range   Sodium 134 (L) 135 - 145 mmol/L   Potassium 3.9 3.5 - 5.1 mmol/L   Chloride 102 98 - 111 mmol/L   CO2 24 22 - 32 mmol/L   Glucose, Bld 89 70 - 99 mg/dL    Comment: Glucose reference range applies only to samples taken after fasting for at least 8 hours.   BUN 11 6 - 20 mg/dL   Creatinine, Ser 6.21 0.44 - 1.00 mg/dL   Calcium 8.9 8.9 - 30.8 mg/dL   GFR, Estimated >65 >78 mL/min    Comment: (NOTE) Calculated using the CKD-EPI Creatinine Equation (2021)    Anion gap 8 5 - 15    Comment: Performed at [facility], [address] [place], [place] [address]  Sedimentation rate     Status: None   Collection Time: 11/11/23  1:32 PM  Result Value Ref Range   Sed Rate 8 0 - 20 mm/hr    Comment: Performed at [facility], [address] [place], [place] [address]  POC Urine Pregnancy, ED     Status: None   Collection Time: 11/11/23  3:03 PM  Result Value  Ref Range   Preg Test, Ur Negative Negative  Iron, TIBC and Ferritin Panel     Status: None   Collection Time: 11/17/23  2:01 PM  Result Value Ref Range   Total Iron Binding Capacity 414 250 - 450 ug/dL   UIBC 132 440 - 102 ug/dL   Iron 96 27 - 725 ug/dL   Iron Saturation 23 15 - 55 %   Ferritin 23 15 - 150 ng/mL  CMP14+EGFR     Status: None   Collection Time: 11/17/23  2:01 PM  Result Value Ref Range   Glucose 82 70 - 99 mg/dL   BUN 10 6 - 24 mg/dL   Creatinine, Ser 3.66 0.57 - 1.00 mg/dL   eGFR 78 >44 IH/KVQ/2.59   BUN/Creatinine Ratio 11 9 - 23   Sodium 139 134 - 144 mmol/L   Potassium 4.4 3.5 - 5.2 mmol/L   Chloride 101 96 - 106 mmol/L  CO2 24 20 - 29 mmol/L   Calcium 9.6 8.7 - 10.2 mg/dL   Total Protein 7.2 6.0 - 8.5 g/dL   Albumin 4.5 3.9 - 4.9 g/dL   Globulin, Total 2.7 1.5 - 4.5 g/dL   Bilirubin Total 0.5 0.0 - 1.2 mg/dL   Alkaline Phosphatase 88 44 - 121 IU/L   AST 19 0 - 40 IU/L   ALT 21 0 - 32 IU/L  Vitamin B12     Status: Abnormal   Collection Time: 11/17/23  2:01 PM  Result Value Ref Range   Vitamin B-12 1,613 (H) 232 - 1,245 pg/mL  TSH+T4F+T3Free     Status: None   Collection Time: 11/17/23  2:01 PM  Result Value Ref Range   TSH 2.810 0.450 - 4.500 uIU/mL   T3, Free 3.4 2.0 - 4.4 pg/mL   Free T4 1.25 0.82 - 1.77 ng/dL       Assessment & Plan:   Problem List Items Addressed This Visit       Endocrine   Thyroid  nodule incidentally noted on imaging study   Relevant Orders   US  THYROID  (Completed)   Other Visit Diagnoses       NPDH (new persistent daily headache)    -  Primary   Relevant Orders   Iron, TIBC and Ferritin Panel (Completed)   CMP14+EGFR (Completed)   Vitamin B12 (Completed)     Thyroid  function test abnormal       Relevant Orders   TSH+T4F+T3Free (Completed)      Checking labs today Also rechecking thyroid  ultrasound  Will call pt. With results when available.   Return in about 2 weeks (around 12/01/2023).   Total time  spent: 20 minutes  Trenda Frisk, FNP  11/17/2023   This document may have been prepared by [facility] Voice Recognition software and as such may include unintentional dictation errors.

## 2023-11-18 ENCOUNTER — Encounter: Payer: Self-pay | Admitting: Family

## 2023-11-18 LAB — CMP14+EGFR
ALT: 21 IU/L (ref 0–32)
AST: 19 IU/L (ref 0–40)
Albumin: 4.5 g/dL (ref 3.9–4.9)
Alkaline Phosphatase: 88 IU/L (ref 44–121)
BUN/Creatinine Ratio: 11 (ref 9–23)
BUN: 10 mg/dL (ref 6–24)
Bilirubin Total: 0.5 mg/dL (ref 0.0–1.2)
CO2: 24 mmol/L (ref 20–29)
Calcium: 9.6 mg/dL (ref 8.7–10.2)
Chloride: 101 mmol/L (ref 96–106)
Creatinine, Ser: 0.91 mg/dL (ref 0.57–1.00)
Globulin, Total: 2.7 g/dL (ref 1.5–4.5)
Glucose: 82 mg/dL (ref 70–99)
Potassium: 4.4 mmol/L (ref 3.5–5.2)
Sodium: 139 mmol/L (ref 134–144)
Total Protein: 7.2 g/dL (ref 6.0–8.5)
eGFR: 78 mL/min/{1.73_m2} (ref >59)

## 2023-11-18 LAB — IRON,TIBC AND FERRITIN PANEL
Ferritin: 23 ng/mL (ref 15–150)
Iron Saturation: 23 % (ref 15–55)
Iron: 96 ug/dL (ref 27–159)
Total Iron Binding Capacity: 414 ug/dL (ref 250–450)
UIBC: 318 ug/dL (ref 131–425)

## 2023-11-18 LAB — VITAMIN B12: Vitamin B-12: 1613 pg/mL — ABNORMAL HIGH (ref 232–1245)

## 2023-11-18 LAB — TSH+T4F+T3FREE
Free T4: 1.25 ng/dL (ref 0.82–1.77)
T3, Free: 3.4 pg/mL (ref 2.0–4.4)
TSH: 2.81 u[IU]/mL (ref 0.450–4.500)

## 2023-11-24 ENCOUNTER — Ambulatory Visit (INDEPENDENT_AMBULATORY_CARE_PROVIDER_SITE_OTHER)

## 2023-11-24 DIAGNOSIS — E041 Nontoxic single thyroid nodule: Secondary | ICD-10-CM | POA: Diagnosis not present

## 2023-11-30 ENCOUNTER — Ambulatory Visit: Admitting: Family

## 2023-12-02 ENCOUNTER — Ambulatory Visit: Admitting: Family

## 2023-12-02 ENCOUNTER — Encounter: Payer: Self-pay | Admitting: Family

## 2023-12-02 VITALS — BP 112/82 | HR 65 | Ht 65.0 in | Wt 229.4 lb

## 2023-12-02 DIAGNOSIS — F5104 Psychophysiologic insomnia: Secondary | ICD-10-CM

## 2023-12-02 DIAGNOSIS — R9389 Abnormal findings on diagnostic imaging of other specified body structures: Secondary | ICD-10-CM

## 2023-12-02 DIAGNOSIS — F411 Generalized anxiety disorder: Secondary | ICD-10-CM | POA: Diagnosis not present

## 2023-12-02 DIAGNOSIS — Z0130 Encounter for examination of blood pressure without abnormal findings: Secondary | ICD-10-CM

## 2023-12-02 MED ORDER — CLONAZEPAM 0.5 MG PO TABS: 0.5000 mg | ORAL_TABLET | Freq: Every evening | ORAL | 0 refills | Status: AC | PRN

## 2023-12-02 NOTE — Progress Notes (Signed)
 Established Patient Office Visit  Subjective:  Patient ID: Kristin Vargas, female    DOB: 03-13-1976  Age: 48 y.o. MRN: [id number]  Chief Complaint  Patient presents with   Follow-up    Discuss lab and U/S results    Patient is here today for follow up appointment.  She had labs done and has had her thyroid ultrasound since her last appointment. 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  Needle biopsy was suggested. After discussion today patient does want to proceed with this.  She also states that as a result of her having seen the ultrasound result on MyChart she has been having trouble sleeping. Patient is tearful in office today as she does have a strong family history of cancer and is very anxious about her results.    No other concerns at this time.   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   Impingement of anterior part of ankle 04/11/2019   Peroneal tendinitis 04/11/2019   Strain of peroneal tendon 04/11/2019   Stress fracture of metatarsal bone 08/03/2019   Synovitis of ankle 01/01/2020    Past Surgical History:  Procedure Laterality Date   APPENDECTOMY      Social History   Socioeconomic History   Marital status: Married    Spouse name: albano   Number of children: 1   Years of education: Not on file   Highest education level: GED or equivalent  Occupational History   Not on file  Tobacco Use   Smoking status: Former    Current packs/day: 0.00    Types: Cigarettes    Start date: 22    Quit date: 07/25/2000    Years since quitting: 23.3   Smokeless tobacco: Never  Vaping Use   Vaping status: Never Used  Substance and Sexual Activity   Alcohol use: Never   Drug use: Never   Sexual activity: Yes    Birth control/protection: None  Other Topics Concern   Not on file  Social History Narrative    Not on file   Social Drivers of Health   Financial Resource Strain: Low Risk  (07/25/2018)   Overall Financial Resource Strain (CARDIA)    Difficulty of Paying Living Expenses: Not hard at all  Food Insecurity: No Food Insecurity (07/25/2018)   Hunger Vital Sign    Worried About Running Out of Food in the Last Year: Never true    Ran Out of Food in the Last Year: Never true  Transportation Needs: No Transportation Needs (07/25/2018)   PRAPARE - Administrator, Civil Service (Medical): No    Lack of Transportation (Non-Medical): No  Physical Activity: Inactive (07/25/2018)   Exercise Vital Sign    Days of Exercise per Week: 0 days    Minutes of Exercise per Session: 0 min  Stress: Stress Concern Present (07/25/2018)   [employer] of Occupational Health - Occupational Stress Questionnaire    Feeling of Stress : To some extent  Social Connections: Unknown (07/25/2018)   Social Connection and Isolation Panel [NHANES]    Frequency of Communication with Friends and Family: Not on file    Frequency of Social Gatherings with Friends and Family: Not on file    Attends Religious Services: Never    Active Member of Clubs or Organizations: No    Attends Club or  Organization Meetings: Never    Marital Status: Married  Catering manager Violence: Not At Risk (07/25/2018)   Humiliation, Afraid, Rape, and Kick questionnaire    Fear of Current or Ex-Partner: No    Emotionally Abused: No    Physically Abused: No    Sexually Abused: No    Family History  Problem Relation Age of Onset   Alcohol abuse Mother    Epilepsy Paternal Grandmother    Epilepsy Brother     Allergies  Allergen Reactions   Morphine And Codeine Hives and Other (See Comments)   Sulfa Antibiotics Hives and Itching   Vicodin [Hydrocodone-Acetaminophen] Hives   Penicillins Other (See Comments) and Rash    Does not work for patient    Review of Systems  Psychiatric/Behavioral:  The patient is  nervous/anxious and has insomnia.   All other systems reviewed and are negative.      Objective:   BP 112/82   Pulse 65   Ht 5\' 5"  (1.651 m)   Wt 229 lb 6.4 oz (104.1 kg)   SpO2 99%   BMI 38.17 kg/m   Vitals:   12/02/23 0921  BP: 112/82  Pulse: 65  Height: 5\' 5"  (1.651 m)  Weight: 229 lb 6.4 oz (104.1 kg)  SpO2: 99%  BMI (Calculated): 38.17    Physical Exam Vitals and nursing note reviewed.  Constitutional:      General: She is awake.     Appearance: Normal appearance. She is obese.  HENT:     Head: Normocephalic.  Eyes:     Extraocular Movements: Extraocular movements intact.     Conjunctiva/sclera: Conjunctivae normal.     Pupils: Pupils are equal, round, and reactive to light.  Cardiovascular:     Rate and Rhythm: Normal rate.  Pulmonary:     Effort: Pulmonary effort is normal.  Neurological:     General: No focal deficit present.     Mental Status: She is alert and oriented to person, place, and time. Mental status is at baseline.  Psychiatric:        Attention and Perception: Attention normal.        Mood and Affect: Mood is anxious. Affect is tearful.        Behavior: Behavior normal. Behavior is cooperative.        Thought Content: Thought content normal.        Judgment: Judgment normal.      No results found for any visits on 12/02/23.  Recent Results (from the past 2160 hours)  CBC     Status: None   Collection Time: 11/11/23  1:32 PM  Result Value Ref Range   WBC 7.4 4.0 - 10.5 K/uL   RBC 4.90 3.87 - 5.11 MIL/uL   Hemoglobin 13.3 12.0 - 15.0 g/dL   HCT 40.9 81.1 - 91.4 %   MCV 82.2 80.0 - 100.0 fL   MCH 27.1 26.0 - 34.0 pg   MCHC 33.0 30.0 - 36.0 g/dL   RDW 78.2 95.6 - 21.3 %   Platelets 296 150 - 400 K/uL   nRBC 0.0 0.0 - 0.2 %    Comment: Performed at [facility], [address] [place], [place] [address]  Basic metabolic panel     Status: Abnormal   Collection Time: 11/11/23  1:32 PM  Result Value Ref Range   Sodium  134 (L) 135 - 145 mmol/L   Potassium 3.9 3.5 - 5.1 mmol/L   Chloride 102 98 - 111 mmol/L  CO2 24 22 - 32 mmol/L   Glucose, Bld 89 70 - 99 mg/dL    Comment: Glucose reference range applies only to samples taken after fasting for at least 8 hours.   BUN 11 6 - 20 mg/dL   Creatinine, Ser 1.61 0.44 - 1.00 mg/dL   Calcium 8.9 8.9 - 09.6 mg/dL   GFR, Estimated >04 >54 mL/min    Comment: (NOTE) Calculated using the CKD-EPI Creatinine Equation (2021)    Anion gap 8 5 - 15    Comment: Performed at [facility], [address] [place], [place] [address]  Sedimentation rate     Status: None   Collection Time: 11/11/23  1:32 PM  Result Value Ref Range   Sed Rate 8 0 - 20 mm/hr    Comment: Performed at [facility], [address] [place], [place] [address]  POC Urine Pregnancy, ED     Status: None   Collection Time: 11/11/23  3:03 PM  Result Value Ref Range   Preg Test, Ur Negative Negative  Iron, TIBC and Ferritin Panel     Status: None   Collection Time: 11/17/23  2:01 PM  Result Value Ref Range   Total Iron Binding Capacity 414 250 - 450 ug/dL   UIBC 295 621 - 308 ug/dL   Iron 96 27 - 657 ug/dL   Iron Saturation 23 15 - 55 %   Ferritin 23 15 - 150 ng/mL  CMP14+EGFR     Status: None   Collection Time: 11/17/23  2:01 PM  Result Value Ref Range   Glucose 82 70 - 99 mg/dL   BUN 10 6 - 24 mg/dL   Creatinine, Ser 8.46 0.57 - 1.00 mg/dL   eGFR 78 >96 EX/BMW/4.13   BUN/Creatinine Ratio 11 9 - 23   Sodium 139 134 - 144 mmol/L   Potassium 4.4 3.5 - 5.2 mmol/L   Chloride 101 96 - 106 mmol/L   CO2 24 20 - 29 mmol/L   Calcium 9.6 8.7 - 10.2 mg/dL   Total Protein 7.2 6.0 - 8.5 g/dL   Albumin 4.5 3.9 - 4.9 g/dL   Globulin, Total 2.7 1.5 - 4.5 g/dL   Bilirubin Total 0.5 0.0 - 1.2 mg/dL   Alkaline Phosphatase 88 44 - 121 IU/L   AST 19 0 - 40 IU/L   ALT 21 0 - 32 IU/L  Vitamin B12     Status: Abnormal   Collection Time: 11/17/23  2:01 PM  Result Value Ref Range    Vitamin B-12 1,613 (H) 232 - 1,245 pg/mL  TSH+T4F+T3Free     Status: None   Collection Time: 11/17/23  2:01 PM  Result Value Ref Range   TSH 2.810 0.450 - 4.500 uIU/mL   T3, Free 3.4 2.0 - 4.4 pg/mL   Free T4 1.25 0.82 - 1.77 ng/dL       Assessment & Plan:   Problem List Items Addressed This Visit       Other   Anxiety state   Other Visit Diagnoses       Abnormal imaging of thyroid    -  Primary   Sending referral to Endocrinology for follow up after Ti-RADS 3 in left lobe.   Relevant Orders   Ambulatory referral to Endocrinology     Psychophysiological insomnia       Sending clonazepam for patient to help with her sleep issues.       Return in about 1 month (around 01/02/2024) for  F/U.   Total time spent: 20 minutes  Miki Kins, FNP  12/02/2023   This document may have been prepared by [facility] Voice Recognition software and as such may include unintentional dictation errors.

## 2023-12-06 ENCOUNTER — Telehealth: Payer: Self-pay | Admitting: Family

## 2023-12-06 NOTE — Telephone Encounter (Signed)
 Pt called stting the phramacy is telling her they cannot fill her NURTEC 75 MG Because they are saying it needs a PA, Please advise Pt is currently out & the pharm said the earliest they could get it is this Wednesday

## 2024-01-04 ENCOUNTER — Encounter: Payer: Self-pay | Admitting: Family

## 2024-01-04 ENCOUNTER — Ambulatory Visit: Admitting: Family

## 2024-01-04 VITALS — BP 100/70 | HR 75 | Ht 65.0 in | Wt 233.0 lb

## 2024-01-04 DIAGNOSIS — M542 Cervicalgia: Secondary | ICD-10-CM

## 2024-01-04 DIAGNOSIS — E041 Nontoxic single thyroid nodule: Secondary | ICD-10-CM

## 2024-01-04 DIAGNOSIS — I10 Essential (primary) hypertension: Secondary | ICD-10-CM

## 2024-01-04 MED ORDER — LOSARTAN POTASSIUM 25 MG PO TABS: 25.0000 mg | ORAL_TABLET | Freq: Every day | ORAL | 3 refills | Status: DC

## 2024-01-04 MED ORDER — NURTEC 75 MG PO TBDP: 75.0000 mg | ORAL_TABLET | ORAL | 6 refills | Status: AC

## 2024-01-04 NOTE — Progress Notes (Unsigned)
 Established Patient Office Visit  Subjective:  Patient ID: Kristin Vargas, female    DOB: Dec 16, 1975  Age: 48 y.o. MRN: [id number]  Chief Complaint  Patient presents with   Follow-up    1 month follow up    Patient is here today for her 1 month follow up.  She has been feeling fairly well since last appointment.   She does have additional concerns to discuss today. She has been having neck pain which starts at the base of her skull and radiates down to shoulder.  This has been going on for about 6 months, with no known injury mechanism. She describes the pain as sharp, worse with movement.  Tylenol , Advil , Heat, Biofreeze have not helped.  She would like to see physical therapy instead of just taking medication for it.    She did hear from Dr. Odella Bending, has already had her biopsy and it was benign.   Labs are not due today. She needs refills.   I have reviewed her active problem list, medication list, allergies, notes from last encounter, lab results for her appointment today.      No other concerns at this time.   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   Impingement of anterior part of ankle 04/11/2019   Peroneal tendinitis 04/11/2019   Strain of peroneal tendon 04/11/2019   Stress fracture of metatarsal bone 08/03/2019   Synovitis of ankle 01/01/2020    Past Surgical History:  Procedure Laterality Date   APPENDECTOMY      Social History   Socioeconomic History   Marital status: Married    Spouse name: albano   Number of children: 1   Years of education: Not on file   Highest education level: GED or equivalent  Occupational History   Not on file  Tobacco Use   Smoking status: Former    Current packs/day: 0.00    Types: Cigarettes    Start date: 7    Quit date: 07/25/2000    Years since quitting: 23.4   Smokeless tobacco: Never  Vaping Use   Vaping  status: Never Used  Substance and Sexual Activity   Alcohol use: Never   Drug use: Never   Sexual activity: Yes    Birth control/protection: None  Other Topics Concern   Not on file  Social History Narrative   Not on file   Social Drivers of Health   Financial Resource Strain: Low Risk  (07/25/2018)   Overall Financial Resource Strain (CARDIA)    Difficulty of Paying Living Expenses: Not hard at all  Food Insecurity: No Food Insecurity (12/21/2023)   Received [facility]   Hunger Vital Sign    Worried About Running Out of Food in the Last Year: Never true    Ran Out of Food in the Last Year: Never true  Transportation Needs: No Transportation Needs (12/21/2023)   Received from [facility]   PRAPARE - Transportation    Lack of Transportation (Medical): No    Lack of Transportation (Non-Medical): No  Physical Activity: Inactive (07/25/2018)   Exercise Vital Sign    Days of Exercise per Week: 0 days    Minutes of Exercise per Session: 0 min  Stress: Stress Concern Present (07/25/2018)   [employer] of Occupational Health - Occupational Stress Questionnaire    Feeling of Stress : To some extent  Social Connections:  Unknown (07/25/2018)   Social Connection and Isolation Panel [NHANES]    Frequency of Communication with Friends and Family: Not on file    Frequency of Social Gatherings with Friends and Family: Not on file    Attends Religious Services: Never    Active Member of Clubs or Organizations: No    Attends Banker Meetings: Never    Marital Status: Married  Catering manager Violence: Not At Risk (07/25/2018)   Humiliation, Afraid, Rape, and Kick questionnaire    Fear of Current or Ex-Partner: No    Emotionally Abused: No    Physically Abused: No    Sexually Abused: No    Family History  Problem Relation Age of Onset   Alcohol abuse Mother    Epilepsy Paternal Grandmother    Epilepsy Brother     Allergies  Allergen Reactions    Morphine And Codeine Hives and Other (See Comments)   Sulfa Antibiotics Hives and Itching   Vicodin [Hydrocodone-Acetaminophen ] Hives   Penicillins Other (See Comments) and Rash    Does not work for patient    Review of Systems  Musculoskeletal:  Positive for neck pain.  All other systems reviewed and are negative.      Objective:   BP 100/70   Pulse 75   Ht 5\' 5"  (1.651 m)   Wt 233 lb (105.7 kg)   SpO2 99%   BMI 38.77 kg/m   Vitals:   01/04/24 0853  BP: 100/70  Pulse: 75  Height: 5\' 5"  (1.651 m)  Weight: 233 lb (105.7 kg)  SpO2: 99%  BMI (Calculated): 38.77    Physical Exam Vitals and nursing note reviewed.  Constitutional:      Appearance: Normal appearance. She is normal weight.  HENT:     Head: Normocephalic.  Eyes:     Extraocular Movements: Extraocular movements intact.     Conjunctiva/sclera: Conjunctivae normal.     Pupils: Pupils are equal, round, and reactive to light.  Cardiovascular:     Rate and Rhythm: Normal rate.  Pulmonary:     Effort: Pulmonary effort is normal.  Musculoskeletal:     Cervical back: Tenderness present.  Neurological:     General: No focal deficit present.     Mental Status: She is alert and oriented to person, place, and time. Mental status is at baseline.  Psychiatric:        Mood and Affect: Mood normal.        Behavior: Behavior normal.        Thought Content: Thought content normal.        Judgment: Judgment normal.      No results found for any visits on 01/04/24.  Recent Results (from the past 2160 hours)  CBC     Status: None   Collection Time: 11/11/23  1:32 PM  Result Value Ref Range   WBC 7.4 4.0 - 10.5 K/uL   RBC 4.90 3.87 - 5.11 MIL/uL   Hemoglobin 13.3 12.0 - 15.0 g/dL   HCT 16.1 09.6 - 04.5 %   MCV 82.2 80.0 - 100.0 fL   MCH 27.1 26.0 - 34.0 pg   MCHC 33.0 30.0 - 36.0 g/dL   RDW 40.9 81.1 - 91.4 %   Platelets 296 150 - 400 K/uL   nRBC 0.0 0.0 - 0.2 %    Comment: Performed at [facility], [address] [place], [place] [address]  Basic metabolic panel     Status: Abnormal  Collection Time: 11/11/23  1:32 PM  Result Value Ref Range   Sodium 134 (L) 135 - 145 mmol/L   Potassium 3.9 3.5 - 5.1 mmol/L   Chloride 102 98 - 111 mmol/L   CO2 24 22 - 32 mmol/L   Glucose, Bld 89 70 - 99 mg/dL    Comment: Glucose reference range applies only to samples taken after fasting for at least 8 hours.   BUN 11 6 - 20 mg/dL   Creatinine, Ser 1.19 0.44 - 1.00 mg/dL   Calcium 8.9 8.9 - 14.7 mg/dL   GFR, Estimated >82 >95 mL/min    Comment: (NOTE) Calculated using the CKD-EPI Creatinine Equation (2021)    Anion gap 8 5 - 15    Comment: Performed at [facility], [address] [place], [place] [address]  Sedimentation rate     Status: None   Collection Time: 11/11/23  1:32 PM  Result Value Ref Range   Sed Rate 8 0 - 20 mm/hr    Comment: Performed at [facility], [address] [place], [place] [address]  POC Urine Pregnancy, ED     Status: None   Collection Time: 11/11/23  3:03 PM  Result Value Ref Range   Preg Test, Ur Negative Negative  Iron, TIBC and Ferritin Panel     Status: None   Collection Time: 11/17/23  2:01 PM  Result Value Ref Range   Total Iron Binding Capacity 414 250 - 450 ug/dL   UIBC 469 629 - 528 ug/dL   Iron 96 27 - 413 ug/dL   Iron Saturation 23 15 - 55 %   Ferritin 23 15 - 150 ng/mL  CMP14+EGFR     Status: None   Collection Time: 11/17/23  2:01 PM  Result Value Ref Range   Glucose 82 70 - 99 mg/dL   BUN 10 6 - 24 mg/dL   Creatinine, Ser 2.44 0.57 - 1.00 mg/dL   eGFR 78 >01 UU/VOZ/3.66   BUN/Creatinine Ratio 11 9 - 23   Sodium 139 134 - 144 mmol/L   Potassium 4.4 3.5 - 5.2 mmol/L   Chloride 101 96 - 106 mmol/L   CO2 24 20 - 29 mmol/L   Calcium 9.6 8.7 - 10.2 mg/dL   Total Protein 7.2 6.0 - 8.5 g/dL   Albumin 4.5 3.9 - 4.9 g/dL   Globulin, Total 2.7 1.5 - 4.5 g/dL   Bilirubin Total 0.5 0.0 - 1.2 mg/dL   Alkaline  Phosphatase 88 44 - 121 IU/L   AST 19 0 - 40 IU/L   ALT 21 0 - 32 IU/L  Vitamin B12     Status: Abnormal   Collection Time: 11/17/23  2:01 PM  Result Value Ref Range   Vitamin B-12 1,613 (H) 232 - 1,245 pg/mL  TSH+T4F+T3Free     Status: None   Collection Time: 11/17/23  2:01 PM  Result Value Ref Range   TSH 2.810 0.450 - 4.500 uIU/mL   T3, Free 3.4 2.0 - 4.4 pg/mL   Free T4 1.25 0.82 - 1.77 ng/dL       Assessment & Plan:   Problem List Items Addressed This Visit   None   No follow-ups on file.  Total time spent: 20 minutes  Trenda Frisk, FNP  01/04/2024   This document may have been prepared by [facility] Voice Recognition software and as such may include unintentional dictation errors.

## 2024-01-05 ENCOUNTER — Telehealth: Payer: Self-pay | Admitting: Family

## 2024-01-05 NOTE — Telephone Encounter (Signed)
 Patient called in reporting that her BP is 145/104. States she did take her losartan  (the whole pill) this morning. Did not take a Nurtec today because it is her "skip day". Has had a migraine for the last 3 hours and is shaky. Please advise on what she should do.

## 2024-01-06 ENCOUNTER — Encounter: Payer: Self-pay | Admitting: Family

## 2024-01-06 ENCOUNTER — Other Ambulatory Visit: Payer: Self-pay

## 2024-01-06 MED ORDER — IBUPROFEN 800 MG PO TABS: 800.0000 mg | ORAL_TABLET | Freq: Three times a day (TID) | ORAL | 3 refills | Status: DC | PRN

## 2024-01-06 MED ORDER — ONDANSETRON 4 MG PO TBDP: 4.0000 mg | ORAL_TABLET | Freq: Three times a day (TID) | ORAL | 0 refills | Status: AC | PRN

## 2024-01-06 MED ORDER — PROPRANOLOL HCL 20 MG PO TABS: 20.0000 mg | ORAL_TABLET | Freq: Two times a day (BID) | ORAL | 1 refills | Status: DC

## 2024-01-07 ENCOUNTER — Encounter: Payer: Self-pay | Admitting: Family

## 2024-01-07 DIAGNOSIS — I10 Essential (primary) hypertension: Secondary | ICD-10-CM | POA: Insufficient documentation

## 2024-01-07 DIAGNOSIS — E041 Nontoxic single thyroid nodule: Secondary | ICD-10-CM | POA: Insufficient documentation

## 2024-01-07 NOTE — Assessment & Plan Note (Signed)
 Continue losartan , as bp is well controlled today.  Will reassess at follow up.

## 2024-01-07 NOTE — Assessment & Plan Note (Signed)
Patient is seen by Endocrinology, who manage this condition.  She is well controlled with current therapy.   Will defer to them for further changes to plan of care.

## 2024-02-05 ENCOUNTER — Encounter: Payer: Self-pay | Admitting: Family

## 2024-03-08 ENCOUNTER — Other Ambulatory Visit: Payer: Self-pay | Admitting: Family

## 2024-03-28 ENCOUNTER — Other Ambulatory Visit: Payer: Self-pay | Admitting: Cardiology

## 2024-04-04 ENCOUNTER — Ambulatory Visit: Admitting: Family

## 2024-05-12 ENCOUNTER — Encounter: Payer: Self-pay | Admitting: Cardiology

## 2024-05-12 ENCOUNTER — Ambulatory Visit: Admitting: Cardiology

## 2024-05-12 VITALS — BP 130/78 | HR 74 | Ht 65.0 in | Wt 227.0 lb

## 2024-05-12 DIAGNOSIS — J0100 Acute maxillary sinusitis, unspecified: Secondary | ICD-10-CM

## 2024-05-12 MED ORDER — AZITHROMYCIN 250 MG PO TABS: ORAL_TABLET | ORAL | 0 refills | Status: AC

## 2024-05-12 MED ORDER — FLUTICASONE PROPIONATE 50 MCG/ACT NA SUSP: 1.0000 | Freq: Every day | NASAL | 2 refills | Status: AC

## 2024-05-12 MED ORDER — MONTELUKAST SODIUM 10 MG PO TABS: 10.0000 mg | ORAL_TABLET | Freq: Every day | ORAL | 2 refills | Status: AC

## 2024-05-12 NOTE — Progress Notes (Signed)
 Established Patient Office Visit  Subjective:  Patient ID: Kristin Vargas, female    DOB: [birth year]  Age: 48 y.o. MRN: [id number]  Chief Complaint  Patient presents with   Acute Visit    Sinus Infection x 3 days, Negative Covid, No Fever, No Cough, Snotty and Sinus Pressure behind eyes    Patient in office for an acute visit, complaining of a possible sinus infection. Negative at home Covid test. Patient complaining of PND, sinus pain, pressure and congestion started 3 days ago. Denies fever, cough, sneezing, and sore throat.  Will send in Z-pack, Floanse, Singulair . Recommend Mucinex, increase water intake.   Sinusitis This is a new problem. The current episode started in the past 7 days. The problem is unchanged. There has been no fever. Associated symptoms include congestion, a hoarse voice and sinus pressure. Pertinent negatives include no chills, coughing, headaches, shortness of breath, sneezing or sore throat. Treatments tried: Sudafed. The treatment provided no relief.    No other concerns at this time.   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   Impingement of anterior part of ankle 04/11/2019   Peroneal tendinitis 04/11/2019   Strain of peroneal tendon 04/11/2019   Stress fracture of metatarsal bone 08/03/2019   Synovitis of ankle 01/01/2020    Past Surgical History:  Procedure Laterality Date   APPENDECTOMY      Social History   Socioeconomic History   Marital status: Married    Spouse name: albano   Number of children: 1   Years of education: Not on file   Highest education level: GED or equivalent  Occupational History   Not on file  Tobacco Use   Smoking status: Former    Current packs/day: 0.00    Types: Cigarettes    Start date: 53    Quit date: 07/25/2000    Years since quitting: 23.8   Smokeless tobacco: Never  Vaping Use   Vaping status:  Never Used  Substance and Sexual Activity   Alcohol use: Never   Drug use: Never   Sexual activity: Yes    Birth control/protection: None  Other Topics Concern   Not on file  Social History Narrative   Not on file   Social Drivers of Health   Financial Resource Strain: Low Risk  (07/25/2018)   Overall Financial Resource Strain (CARDIA)    Difficulty of Paying Living Expenses: Not hard at all  Food Insecurity: No Food Insecurity (12/21/2023)   Received from [facility]   Hunger Vital Sign    Within the past 12 months, you worried that your food would run out before you got the money to buy more.: Never true    Within the past 12 months, the food you bought just didn't last and you didn't have money to get more.: Never true  Transportation Needs: No Transportation Needs (12/21/2023)   Received from [facility]   PRAPARE - Transportation    Lack of Transportation (Medical): No    Lack of Transportation (Non-Medical): No  Physical Activity: Inactive (07/25/2018)   Exercise Vital Sign    Days of Exercise per Week: 0 days    Minutes of Exercise per Session: 0 min  Stress: Stress Concern Present (07/25/2018)   [employer] of Occupational Health - Occupational Stress Questionnaire    Feeling of Stress : To some extent  Social Connections: Unknown (07/25/2018)  Social Advertising account executive    Frequency of Communication with Friends and Family: Not on file    Frequency of Social Gatherings with Friends and Family: Not on file    Attends Religious Services: Never    Active Member of Clubs or Organizations: No    Attends Banker Meetings: Never    Marital Status: Married  Catering manager Violence: Not At Risk (07/25/2018)   Humiliation, Afraid, Rape, and Kick questionnaire    Fear of Current or Ex-Partner: No    Emotionally Abused: No    Physically Abused: No    Sexually Abused: No    Family History  Problem Relation Age of Onset   Alcohol  abuse Mother    Epilepsy Paternal Grandmother    Epilepsy Brother     Allergies  Allergen Reactions   Morphine And Codeine Hives and Other (See Comments)   Hydrocodone-Acetaminophen  Hives   Penicillins Other (See Comments), Rash and Dermatitis    Does not work for patient   Sulfa Antibiotics Hives and Itching    Outpatient Medications Prior to Visit  Medication Sig   albuterol  (VENTOLIN  HFA) 108 (90 Base) MCG/ACT inhaler Inhale 2 puffs into the lungs every 6 (six) hours as needed for wheezing or shortness of breath.   cholecalciferol (VITAMIN D ) 25 MCG (1000 UT) tablet Take 1,000 Units by mouth daily.   clonazePAM  (KLONOPIN ) 0.5 MG tablet Take 1 tablet (0.5 mg total) by mouth at bedtime as needed for anxiety.   ibuprofen  (ADVIL ) 800 MG tablet TAKE 1 TABLET BY MOUTH EVERY 8 HOURS AS NEEDED   losartan  (COZAAR ) 25 MG tablet Take 1 tablet (25 mg total) by mouth daily.   Multiple Vitamin (MULTIVITAMIN WITH MINERALS) TABS tablet Take 1 tablet by mouth daily.   ondansetron  (ZOFRAN -ODT) 4 MG disintegrating tablet Take 1 tablet (4 mg total) by mouth every 8 (eight) hours as needed for nausea or vomiting.   propranolol  (INDERAL ) 20 MG tablet TAKE 1 TABLET(20 MG) BY MOUTH TWICE DAILY   Rimegepant Sulfate (NURTEC) 75 MG TBDP Take 1 tablet (75 mg total) by mouth every other day.   triazolam (HALCION) 0.125 MG tablet Take 0.125 mg by mouth at bedtime as needed. USES FOR DENTAL APPTS   [DISCONTINUED] fluticasone  (FLONASE ) 50 MCG/ACT nasal spray Place 1 spray into both nostrils daily.   [DISCONTINUED] montelukast  (SINGULAIR ) 10 MG tablet Take 1 tablet (10 mg total) by mouth at bedtime.   [DISCONTINUED] benzonatate  (TESSALON  PERLES) 100 MG capsule Take 1 capsule (100 mg total) by mouth 3 (three) times daily as needed for cough. (Patient not taking: Reported on 01/04/2024)   No facility-administered medications prior to visit.    Review of Systems  Constitutional: Negative.  Negative for chills.   HENT:  Positive for congestion, hoarse voice, sinus pressure and sinus pain. Negative for sneezing and sore throat.   Eyes: Negative.   Respiratory: Negative.  Negative for cough and shortness of breath.   Cardiovascular: Negative.  Negative for chest pain.  Gastrointestinal: Negative.  Negative for abdominal pain, constipation and diarrhea.  Genitourinary: Negative.   Musculoskeletal:  Negative for joint pain and myalgias.  Skin: Negative.   Neurological: Negative.  Negative for dizziness and headaches.  Endo/Heme/Allergies: Negative.   All other systems reviewed and are negative.      Objective:   BP 130/78   Pulse 74   Ht 5' 5 (1.651 m)   Wt 227 lb (103 kg)   SpO2 98%   BMI  37.77 kg/m   Vitals:   05/12/24 0949  BP: 130/78  Pulse: 74  Height: 5' 5 (1.651 m)  Weight: 227 lb (103 kg)  SpO2: 98%  BMI (Calculated): 37.77    Physical Exam Vitals and nursing note reviewed.  Constitutional:      Appearance: Normal appearance. She is normal weight.  HENT:     Head: Normocephalic and atraumatic.     Nose: Nose normal.     Mouth/Throat:     Mouth: Mucous membranes are moist.  Eyes:     Extraocular Movements: Extraocular movements intact.     Conjunctiva/sclera: Conjunctivae normal.     Pupils: Pupils are equal, round, and reactive to light.  Cardiovascular:     Rate and Rhythm: Normal rate and regular rhythm.     Pulses: Normal pulses.     Heart sounds: Normal heart sounds.  Pulmonary:     Effort: Pulmonary effort is normal.     Breath sounds: Normal breath sounds.  Abdominal:     General: Abdomen is flat. Bowel sounds are normal.     Palpations: Abdomen is soft.  Musculoskeletal:        General: Normal range of motion.     Cervical back: Normal range of motion.  Skin:    General: Skin is warm and dry.  Neurological:     General: No focal deficit present.     Mental Status: She is alert and oriented to person, place, and time.  Psychiatric:        Mood  and Affect: Mood normal.        Behavior: Behavior normal.        Thought Content: Thought content normal.        Judgment: Judgment normal.      No results found for any visits on 05/12/24.  No results found for this or any previous visit (from the past 2160 hours).    Assessment & Plan:  Z-pack Flonase  Singulair  Mucinex Increase water intake  Problem List Items Addressed This Visit       Respiratory   Acute non-recurrent maxillary sinusitis - Primary   Relevant Medications   fluticasone  (FLONASE ) 50 MCG/ACT nasal spray   azithromycin  (ZITHROMAX  Z-PAK) 250 MG tablet    Return if symptoms worsen or fail to improve.   Total time spent: 25 minutes  [employer], NP  05/12/2024   This document may have been prepared by [employer] Voice Recognition software and as such may include unintentional dictation errors.

## 2024-05-22 ENCOUNTER — Other Ambulatory Visit: Payer: Self-pay | Admitting: Family

## 2024-07-20 ENCOUNTER — Encounter: Payer: Self-pay | Admitting: Family

## 2024-07-20 ENCOUNTER — Ambulatory Visit: Admitting: Family

## 2024-07-20 VITALS — BP 106/84 | HR 55 | Ht 65.0 in | Wt 230.4 lb

## 2024-07-20 DIAGNOSIS — E782 Mixed hyperlipidemia: Secondary | ICD-10-CM | POA: Diagnosis not present

## 2024-07-20 DIAGNOSIS — F411 Generalized anxiety disorder: Secondary | ICD-10-CM

## 2024-07-20 DIAGNOSIS — Z6835 Body mass index (BMI) 35.0-35.9, adult: Secondary | ICD-10-CM

## 2024-07-20 DIAGNOSIS — R946 Abnormal results of thyroid function studies: Secondary | ICD-10-CM

## 2024-07-20 DIAGNOSIS — E538 Deficiency of other specified B group vitamins: Secondary | ICD-10-CM

## 2024-07-20 DIAGNOSIS — J3089 Other allergic rhinitis: Secondary | ICD-10-CM

## 2024-07-20 DIAGNOSIS — G43709 Chronic migraine without aura, not intractable, without status migrainosus: Secondary | ICD-10-CM

## 2024-07-20 DIAGNOSIS — R7303 Prediabetes: Secondary | ICD-10-CM

## 2024-07-20 DIAGNOSIS — I10 Essential (primary) hypertension: Secondary | ICD-10-CM | POA: Diagnosis not present

## 2024-07-20 DIAGNOSIS — R5383 Other fatigue: Secondary | ICD-10-CM

## 2024-07-20 DIAGNOSIS — E559 Vitamin D deficiency, unspecified: Secondary | ICD-10-CM

## 2024-07-20 NOTE — Assessment & Plan Note (Addendum)
-   Continue healthy diet and exercise as tolerated. - Continue medications as prescribed. - Check labs today

## 2024-07-20 NOTE — Assessment & Plan Note (Addendum)
-   Check labs today - Supplementation recommended based off lab results and will notify patient at that time

## 2024-07-20 NOTE — Assessment & Plan Note (Addendum)
-   provided patient with samples and instructed her on proper administration.

## 2024-07-20 NOTE — Assessment & Plan Note (Addendum)
-   Recommended patient start Flonase  nasal spray daily. - Start Debrox ear drops twice day until her FU. Will perform ear cleaning when she returns.

## 2024-07-20 NOTE — Progress Notes (Signed)
 Established Patient Office Visit  Subjective:  Patient ID: Kristin Vargas, female    DOB: [birth year]  Age: 48 y.o. MRN: [id number]  Chief Complaint  Patient presents with   Headache    Patient is here today for acute visit.  She has been feeling poorly since last appointment.   She does have additional concerns to discuss today. Reports having continuous headache since Monday took her Nurtec and did not resolve her headache so she took her Propanolol without resolution. She reports taking her medications daily. These daily headaches have been daily issue for the last year as reported by the patient. She was seen 10/2023 in the ED and she had head CT that was unremarkable.States headache worsens throughout the day; She has blue light protector screen at work and denies eye strain or changes in her vision. She reports it does not resolve completely as she wakes up each morning and it is still there. Reports it as a vice squeezing the left side of her head. She reports consistent neck pain on the left side. She was doing PT and tried stretching and dry needling and it helped her symptoms while she was going but was unable to keep paying the frequent copays.  Provided patient with samples of Ubrelvy and Zavzpret. Provided her with education on proper administration and to not use them both in the same day.   Labs are due today.  She needs refills.   I have reviewed her active problem list, medication list, allergies, family history, social history, health maintenance, notes from last encounter, lab results for her appointment today.       No other concerns at this time.   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   Asthma    Complication of internal fixation device 01/01/2020   Impingement of anterior part of ankle 04/11/2019   Peroneal tendinitis 04/11/2019   Strain of peroneal tendon 04/11/2019   Stress fracture of metatarsal bone 08/03/2019   Synovitis of  ankle 01/01/2020    Past Surgical History:  Procedure Laterality Date   APPENDECTOMY      Social History   Socioeconomic History   Marital status: Married    Spouse name: albano   Number of children: 1   Years of education: Not on file   Highest education level: GED or equivalent  Occupational History   Not on file  Tobacco Use   Smoking status: Former    Current packs/day: 0.00    Types: Cigarettes    Start date: 55    Quit date: 07/25/2000    Years since quitting: 24.0   Smokeless tobacco: Never  Vaping Use   Vaping status: Never Used  Substance and Sexual Activity   Alcohol use: Never   Drug use: Never   Sexual activity: Yes    Birth control/protection: None  Other Topics Concern   Not on file  Social History Narrative   Not on file   Social Drivers of Health   Financial Resource Strain: Low Risk  (07/25/2018)   Overall Financial Resource Strain (CARDIA)    Difficulty of Paying Living Expenses: Not hard at all  Food Insecurity: No Food Insecurity (12/21/2023)   Received from [facility]   Hunger Vital Sign    Within the past 12 months, you worried that your food would run out before you got the money to buy more.: Never true    Within the past 12 months, the food you bought  just didn't last and you didn't have money to get more.: Never true  Transportation Needs: No Transportation Needs (12/21/2023)   Received from [facility] - Transportation    Lack of Transportation (Medical): No    Lack of Transportation (Non-Medical): No  Physical Activity: Inactive (07/25/2018)   Exercise Vital Sign    Days of Exercise per Week: 0 days    Minutes of Exercise per Session: 0 min  Stress: Stress Concern Present (07/25/2018)   [employer] - Occupational Stress Questionnaire    Feeling of Stress : To some extent  Social Connections: Unknown (07/25/2018)   Social Connection and Isolation Panel    Frequency of Communication  with Friends and Family: Not on file    Frequency of Social Gatherings with Friends and Family: Not on file    Attends Religious Services: Never    Active Member of Clubs or Organizations: No    Attends Banker Meetings: Never    Marital Status: Married  Catering Manager Violence: Not At Risk (07/25/2018)   Humiliation, Afraid, Rape, and Kick questionnaire    Fear of Current or Ex-Partner: No    Emotionally Abused: No    Physically Abused: No    Sexually Abused: No    Family History  Problem Relation Age of Onset   Alcohol abuse Mother    Epilepsy Paternal Grandmother    Epilepsy Brother     Allergies  Allergen Reactions   Morphine And Codeine Hives and Other (See Comments)   Hydrocodone-Acetaminophen  Hives   Penicillins Other (See Comments), Rash and Dermatitis    Does not work for patient   Sulfa Antibiotics Hives and Itching    Review of Systems  Constitutional:  Negative for malaise/fatigue.  HENT: Negative.    Eyes:  Negative for blurred vision and pain.  Respiratory:  Negative for cough and shortness of breath.   Cardiovascular:  Negative for chest pain, palpitations, claudication and leg swelling.  Gastrointestinal:  Negative for abdominal pain, blood in stool, constipation, diarrhea, nausea and vomiting.  Genitourinary:  Negative for dysuria, frequency and urgency.  Musculoskeletal: Negative.   Skin: Negative.   Neurological:  Positive for headaches (consistent migraine without relief from medication). Negative for dizziness, tingling and sensory change.  Endo/Heme/Allergies: Negative.   Psychiatric/Behavioral:  The patient is nervous/anxious.        Objective:   BP 106/84   Pulse (!) 55   Ht 5' 5 (1.651 m)   Wt 230 lb 6.4 oz (104.5 kg)   SpO2 99%   BMI 38.34 kg/m   Vitals:   07/20/24 0946  BP: 106/84  Pulse: (!) 55  Height: 5' 5 (1.651 m)  Weight: 230 lb 6.4 oz (104.5 kg)  SpO2: 99%  BMI (Calculated): 38.34    Physical  Exam Vitals and nursing note reviewed.  Constitutional:      Appearance: Normal appearance.  HENT:     Head: Normocephalic.     Right Ear: There is impacted cerumen.     Left Ear: A middle ear effusion is present.  Eyes:     Extraocular Movements: Extraocular movements intact.     Pupils: Pupils are equal, round, and reactive to light.  Cardiovascular:     Rate and Rhythm: Normal rate and regular rhythm.     Pulses: Normal pulses.     Heart sounds: Normal heart sounds. No murmur heard. Pulmonary:     Effort: Pulmonary effort is normal. No  respiratory distress.     Breath sounds: Normal breath sounds.  Abdominal:     General: There is no distension.     Tenderness: There is no abdominal tenderness.  Musculoskeletal:        General: No tenderness. Normal range of motion.     Cervical back: Normal range of motion and neck supple.     Right lower leg: No edema.     Left lower leg: No edema.  Skin:    General: Skin is warm and dry.     Coloration: Skin is not jaundiced.     Findings: No erythema.  Neurological:     General: No focal deficit present.     Mental Status: She is alert and oriented to person, place, and time.  Psychiatric:        Mood and Affect: Mood normal.        Speech: Speech normal.        Behavior: Behavior is cooperative.        Cognition and Memory: Memory is not impaired.      No results found for any visits on 07/20/24.  No results found for this or any previous visit (from the past 2160 hours).     Assessment & Plan Chronic migraine without aura without status migrainosus, not intractable - provided patient with samples and instructed her on proper administration.  Thyroid  function test abnormal Vitamin D  deficiency, unspecified B12 deficiency due to diet Other fatigue - Check labs today - Supplementation recommended based off lab results and will notify patient at that time   Essential hypertension, benign Mixed  hyperlipidemia Prediabetes Severe obesity with body mass index (BMI) of 35.0 to 35.9 and comorbidity (HCC) - Continue healthy diet and exercise as tolerated. - Continue medications as prescribed. - Check labs today   Anxiety state - Continue medications as prescribed. FU if symptoms worsen or she has thoughts of suicide ideation, self harm or harm of others. - Discussed healthy coping mechanisms and stress reduction.   Environmental and seasonal allergies - Recommended patient start Flonase  nasal spray daily. - Start Debrox ear drops twice day until her FU. Will perform ear cleaning when she returns.     Return in about 10 days (around 07/30/2024) for FU and R ear cleaning.   Total time spent: 25 minutes  Oddis DELENA Cain, FNP  07/20/2024   This document may have been prepared by [facility] Voice Recognition software and as such may include unintentional dictation errors.

## 2024-07-20 NOTE — Assessment & Plan Note (Addendum)
-   Continue medications as prescribed. FU if symptoms worsen or she has thoughts of suicide ideation, self harm or harm of others. - Discussed healthy coping mechanisms and stress reduction.

## 2024-07-21 ENCOUNTER — Ambulatory Visit: Payer: Self-pay

## 2024-07-21 LAB — CBC WITH DIFFERENTIAL/PLATELET
Basophils Absolute: 0.1 x10E3/uL (ref 0.0–0.2)
Basos: 1 %
EOS (ABSOLUTE): 0.4 x10E3/uL (ref 0.0–0.4)
Eos: 5 %
Hematocrit: 43.2 % (ref 34.0–46.6)
Hemoglobin: 14.3 g/dL (ref 11.1–15.9)
Immature Grans (Abs): 0 x10E3/uL (ref 0.0–0.1)
Immature Granulocytes: 0 %
Lymphocytes Absolute: 2.4 x10E3/uL (ref 0.7–3.1)
Lymphs: 36 %
MCH: 29.2 pg (ref 26.6–33.0)
MCHC: 33.1 g/dL (ref 31.5–35.7)
MCV: 88 fL (ref 79–97)
Monocytes Absolute: 0.5 x10E3/uL (ref 0.1–0.9)
Monocytes: 7 %
Neutrophils Absolute: 3.5 x10E3/uL (ref 1.4–7.0)
Neutrophils: 51 %
Platelets: 323 x10E3/uL (ref 150–450)
RBC: 4.9 x10E6/uL (ref 3.77–5.28)
RDW: 13.1 % (ref 11.7–15.4)
WBC: 6.9 x10E3/uL (ref 3.4–10.8)

## 2024-07-21 LAB — CMP14+EGFR
ALT: 26 IU/L (ref 0–32)
AST: 23 IU/L (ref 0–40)
Albumin: 4.5 g/dL (ref 3.9–4.9)
Alkaline Phosphatase: 83 IU/L (ref 41–116)
BUN/Creatinine Ratio: 18 (ref 9–23)
BUN: 16 mg/dL (ref 6–24)
Bilirubin Total: 0.5 mg/dL (ref 0.0–1.2)
CO2: 22 mmol/L (ref 20–29)
Calcium: 9.8 mg/dL (ref 8.7–10.2)
Chloride: 103 mmol/L (ref 96–106)
Creatinine, Ser: 0.89 mg/dL (ref 0.57–1.00)
Globulin, Total: 2.5 g/dL (ref 1.5–4.5)
Glucose: 93 mg/dL (ref 70–99)
Potassium: 4.7 mmol/L (ref 3.5–5.2)
Sodium: 139 mmol/L (ref 134–144)
Total Protein: 7 g/dL (ref 6.0–8.5)
eGFR: 79 mL/min/1.73 (ref >59)

## 2024-07-21 LAB — VITAMIN B12: Vitamin B-12: 670 pg/mL (ref 232–1245)

## 2024-07-21 LAB — TSH+T4F+T3FREE
Free T4: 1.19 ng/dL (ref 0.82–1.77)
T3, Free: 2.6 pg/mL (ref 2.0–4.4)
TSH: 1.84 u[IU]/mL (ref 0.450–4.500)

## 2024-07-21 LAB — VITAMIN D 25 HYDROXY (VIT D DEFICIENCY, FRACTURES): Vit D, 25-Hydroxy: 36.9 ng/mL (ref 30.0–100.0)

## 2024-07-22 ENCOUNTER — Encounter: Payer: Self-pay | Admitting: Family

## 2024-08-02 ENCOUNTER — Ambulatory Visit: Admitting: Family

## 2024-08-07 ENCOUNTER — Ambulatory Visit: Admitting: Family

## 2024-08-07 ENCOUNTER — Encounter: Payer: Self-pay | Admitting: Family

## 2024-08-07 VITALS — BP 110/84 | HR 76 | Ht 65.0 in | Wt 237.0 lb

## 2024-08-07 DIAGNOSIS — R1012 Left upper quadrant pain: Secondary | ICD-10-CM

## 2024-08-07 DIAGNOSIS — R079 Chest pain, unspecified: Secondary | ICD-10-CM

## 2024-08-09 ENCOUNTER — Other Ambulatory Visit: Payer: Self-pay

## 2024-08-09 MED ORDER — UBRELVY 100 MG PO TABS: 100.0000 mg | ORAL_TABLET | Freq: Every day | ORAL | 1 refills | Status: AC

## 2024-08-22 ENCOUNTER — Encounter: Payer: Self-pay | Admitting: Family

## 2024-08-22 ENCOUNTER — Ambulatory Visit: Admitting: Family

## 2024-08-22 DIAGNOSIS — R300 Dysuria: Secondary | ICD-10-CM

## 2024-08-22 LAB — POCT URINALYSIS DIPSTICK
Bilirubin, UA: NEGATIVE
Glucose, UA: NEGATIVE
Ketones, UA: NEGATIVE
Nitrite, UA: NEGATIVE
Protein, UA: NEGATIVE
Spec Grav, UA: 1.02 (ref 1.010–1.025)
Urobilinogen, UA: 0.2 U/dL
pH, UA: 6.5 (ref 5.0–8.0)

## 2024-08-22 MED ORDER — NITROFURANTOIN MONOHYD MACRO 100 MG PO CAPS: 100.0000 mg | ORAL_CAPSULE | Freq: Two times a day (BID) | ORAL | 0 refills | Status: AC

## 2024-08-22 NOTE — Progress Notes (Signed)
   CHIEF COMPLAINT  UA/ only visit fot UTI     REASON FOR VISIT  Possible UTI, UA Visit Only      ASSESSMENT & PLAN Diagnoses and all orders for this visit:  Dysuria -     POCT Urinalysis Dipstick (18997) -     Urinalysis, Routine w reflex microscopic -     Urine Culture   Patient notified.  Total time spent: 5 minutes  ALAN CHRISTELLA ARRANT, FNP 08/22/2024

## 2024-08-23 ENCOUNTER — Other Ambulatory Visit: Payer: Self-pay | Admitting: Family

## 2024-08-23 LAB — URINALYSIS, ROUTINE W REFLEX MICROSCOPIC
Bilirubin, UA: NEGATIVE
Glucose, UA: NEGATIVE
Ketones, UA: NEGATIVE
Nitrite, UA: POSITIVE — AB
Specific Gravity, UA: 1.016 (ref 1.005–1.030)
Urobilinogen, Ur: 0.2 mg/dL (ref 0.2–1.0)
pH, UA: 6.5 (ref 5.0–7.5)

## 2024-08-23 LAB — MICROSCOPIC EXAMINATION
Casts: NONE SEEN /LPF
RBC, Urine: NONE SEEN /HPF (ref 0–2)

## 2024-08-23 MED ORDER — PHENAZOPYRIDINE HCL 100 MG PO TABS: 100.0000 mg | ORAL_TABLET | Freq: Three times a day (TID) | ORAL | 0 refills | Status: AC | PRN

## 2024-08-25 ENCOUNTER — Other Ambulatory Visit: Payer: Self-pay

## 2024-08-27 LAB — URINE CULTURE

## 2024-08-29 ENCOUNTER — Ambulatory Visit: Payer: Self-pay

## 2024-09-05 ENCOUNTER — Ambulatory Visit (INDEPENDENT_AMBULATORY_CARE_PROVIDER_SITE_OTHER): Admitting: Family

## 2024-09-05 DIAGNOSIS — R300 Dysuria: Secondary | ICD-10-CM

## 2024-09-05 LAB — POCT URINALYSIS DIPSTICK
Bilirubin, UA: NEGATIVE
Blood, UA: NEGATIVE
Glucose, UA: NEGATIVE
Ketones, UA: NEGATIVE
Nitrite, UA: NEGATIVE
Protein, UA: NEGATIVE
Spec Grav, UA: 1.015
Urobilinogen, UA: 0.2 U/dL
pH, UA: 6.5

## 2024-09-06 LAB — URINALYSIS, ROUTINE W REFLEX MICROSCOPIC
Bilirubin, UA: NEGATIVE
Glucose, UA: NEGATIVE
Ketones, UA: NEGATIVE
Nitrite, UA: NEGATIVE
Protein,UA: NEGATIVE
RBC, UA: NEGATIVE
Specific Gravity, UA: 1.013 (ref 1.005–1.030)
Urobilinogen, Ur: 0.2 mg/dL (ref 0.2–1.0)
pH, UA: 6.5 (ref 5.0–7.5)

## 2024-09-06 LAB — MICROSCOPIC EXAMINATION
Bacteria, UA: NONE SEEN
Casts: NONE SEEN /LPF
RBC, Urine: NONE SEEN /HPF (ref 0–2)

## 2024-09-06 NOTE — Progress Notes (Signed)
" ° °  CHIEF COMPLAINT  UA/ only visit fot UTI     REASON FOR VISIT  Possible UTI, UA Visit Only      ASSESSMENT & PLAN Diagnoses and all orders for this visit:  Dysuria -     POCT Urinalysis Dipstick (18997) -     Urinalysis, Routine w reflex microscopic -     Urine Culture -     Microscopic Examination     Patient notified.  Total time spent: 5 minutes  ALAN CHRISTELLA ARRANT, FNP 09/05/2024 "

## 2024-09-07 LAB — URINE CULTURE

## 2024-09-11 ENCOUNTER — Ambulatory Visit: Payer: Self-pay

## 2024-09-25 ENCOUNTER — Other Ambulatory Visit: Payer: Self-pay | Admitting: Family

## 2024-09-27 ENCOUNTER — Other Ambulatory Visit: Payer: Self-pay | Admitting: Family

## 2024-09-29 ENCOUNTER — Ambulatory Visit: Admitting: Family

## 2024-10-03 ENCOUNTER — Other Ambulatory Visit: Payer: Self-pay | Admitting: Family

## 2024-10-03 MED ORDER — AZITHROMYCIN 250 MG PO TABS: ORAL_TABLET | ORAL | 0 refills | Status: AC
